# Patient Record
Sex: Male | Born: 1935 | Race: White | Hispanic: No | Smoking: Former smoker
Health system: Southern US, Community
[De-identification: ages and names within clinical notes are randomized; demographics above are authoritative.]

## PROBLEM LIST (undated history)

## (undated) DIAGNOSIS — R413 Other amnesia: Secondary | ICD-10-CM

## (undated) DIAGNOSIS — F988 Other specified behavioral and emotional disorders with onset usually occurring in childhood and adolescence: Secondary | ICD-10-CM

## (undated) DIAGNOSIS — E119 Type 2 diabetes mellitus without complications: Secondary | ICD-10-CM

## (undated) DIAGNOSIS — J449 Chronic obstructive pulmonary disease, unspecified: Secondary | ICD-10-CM

## (undated) DIAGNOSIS — R269 Unspecified abnormalities of gait and mobility: Secondary | ICD-10-CM

## (undated) HISTORY — DX: Chronic obstructive pulmonary disease, unspecified: J44.9

## (undated) HISTORY — DX: Type 2 diabetes mellitus without complications: E11.9

## (undated) HISTORY — DX: Other specified behavioral and emotional disorders with onset usually occurring in childhood and adolescence: F98.8

## (undated) HISTORY — DX: Unspecified abnormalities of gait and mobility: R26.9

## (undated) HISTORY — DX: Other amnesia: R41.3

---

## 2000-10-02 ENCOUNTER — Encounter: Payer: Self-pay | Admitting: Emergency Medicine

## 2000-10-02 ENCOUNTER — Inpatient Hospital Stay (HOSPITAL_COMMUNITY): Admission: AC | Admit: 2000-10-02 | Discharge: 2000-10-07 | Payer: Self-pay

## 2000-10-02 ENCOUNTER — Encounter: Payer: Self-pay | Admitting: Neurological Surgery

## 2000-11-14 ENCOUNTER — Encounter: Payer: Self-pay | Admitting: Neurological Surgery

## 2000-11-14 ENCOUNTER — Encounter: Admission: RE | Admit: 2000-11-14 | Discharge: 2000-11-14 | Payer: Self-pay | Admitting: Neurological Surgery

## 2001-01-06 ENCOUNTER — Emergency Department (HOSPITAL_COMMUNITY): Admission: EM | Admit: 2001-01-06 | Discharge: 2001-01-06 | Payer: Self-pay | Admitting: Emergency Medicine

## 2001-01-06 ENCOUNTER — Encounter: Payer: Self-pay | Admitting: Emergency Medicine

## 2006-07-07 ENCOUNTER — Emergency Department (HOSPITAL_COMMUNITY): Admission: EM | Admit: 2006-07-07 | Discharge: 2006-07-07 | Payer: Self-pay | Admitting: Emergency Medicine

## 2007-01-09 ENCOUNTER — Emergency Department (HOSPITAL_COMMUNITY): Admission: EM | Admit: 2007-01-09 | Discharge: 2007-01-09 | Payer: Self-pay | Admitting: Emergency Medicine

## 2007-01-12 ENCOUNTER — Emergency Department (HOSPITAL_COMMUNITY): Admission: EM | Admit: 2007-01-12 | Discharge: 2007-01-12 | Payer: Self-pay | Admitting: Emergency Medicine

## 2007-03-02 ENCOUNTER — Emergency Department (HOSPITAL_COMMUNITY): Admission: EM | Admit: 2007-03-02 | Discharge: 2007-03-03 | Payer: Self-pay | Admitting: Emergency Medicine

## 2007-03-23 ENCOUNTER — Emergency Department (HOSPITAL_COMMUNITY): Admission: EM | Admit: 2007-03-23 | Discharge: 2007-03-23 | Payer: Self-pay | Admitting: Emergency Medicine

## 2007-07-04 ENCOUNTER — Emergency Department (HOSPITAL_COMMUNITY): Admission: EM | Admit: 2007-07-04 | Discharge: 2007-07-05 | Payer: Self-pay | Admitting: Emergency Medicine

## 2008-04-25 ENCOUNTER — Emergency Department (HOSPITAL_COMMUNITY): Admission: EM | Admit: 2008-04-25 | Discharge: 2008-04-25 | Payer: Self-pay | Admitting: *Deleted

## 2010-04-18 LAB — GLUCOSE, CAPILLARY: Glucose-Capillary: 42 mg/dL — ABNORMAL LOW (ref 70–99)
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
## 2010-05-25 NOTE — Cardiovascular Report (Signed)
[place]. [facility]  Patient:    David Ayala, David Ayala Visit Number: [id number] MRN: [id number]          Service Type: MED Location: [id number] Attending Physician:  Trauma, Md Dictated by:   Darci Needle, M.D. Proc. Date: 10/06/00 Admit Date:  10/02/2000   CC:         Dr. Sandria Bales. [place]  Dr. Stefani Dama  Dr. Loetta Rough, [place], [place]   Cardiac Catheterization  INDICATION FOR PROCEDURE:  [name] is 26 and has a history of hypertrophic cardiomyopathy, mitral regurgitation and a suspicion of coronary artery disease.  A myocardial perfusion study in 1997 suggested inferior infarction and/or ischemia.  He was recently hospitalized at this time with a traumatic neck fracture and coronary angiography along with heart catheterization are felt to be required to stage the patient and determine risks for general anesthesia.  PROCEDURE PERFORMED: 1. Left heart catheterization. 2. Right heart catheterization. 3. Coronary angiography. 4. Left ventriculography.  DESCRIPTION:  After informed consent, an 8-French sheath was placed in the right femoral vein and a 6-French sheath in the right femoral artery.  Both were placed using the modified Seldinger technique following 2% local Xylocaine infiltration.  A 7-French Swan-Ganz balloon-tipped angled thermodilution/hemodynamic catheter was advanced through the right heart with measurement of hemodynamics.  In the main pulmonary artery, an O2 saturation sample was obtained.  Thermodilution cardiac outputs were also performed.  A 6-French A-2 multipurpose catheter was advanced via the 6-French right femoral artery sheath.  An aortic saturation was obtained.  Hemodynamic recordings were then made including a simultaneous wedge LV.  Pullback pressure using the Swan-Ganz catheter through the right heart was then performed.  Left ventriculography was then performed using the A2 multipurpose  catheter in the 35 degree RAO projection using 10 cc/sec for a total of 35 cc of contrast.  A pullback pressure was then made from left ventricular apex to aorta and no obvious gradient was identified.  Coronary angiography was then performed using the multipurpose catheter.  The patient tolerated the procedure without complications.  RESULTS:   I:  Hemodynamic data:       a. Mean wedge pressure 18 mmHg.       b. Pulmonary artery pressure 39/18 mmHg.       c. Right ventricular pressure 42/14 mmHg.       d. Right atrial mean pressure 13 mmHg.       e. Aortic pressure 113/80 mmHg.       f. LV pressure 114/19 mmHg.       g. Cardiac output 6.3 l/min (thermodilution); 5.1 l/min (Fick).  II:  Left ventriculography:  The left ventricle was mildly dilated.  LV       cavity size is upper normal.  Overall contractility is normal.  EF is       estimated to be 60-70%.  Two-plus mitral regurgitation is present.       There is calcification of the mitral annulus noted on cinefluoroscopy. III:  Coronary angiography:       a. Left main coronary:  Large and free of any significant obstruction.       b. Left anterior descending coronary:  The LAD gives origin to three          small diagonal branches.  The LAD wraps around the apex.  There is          20-30% narrowing in the proximal LAD.  c. Circumflex artery:  The circumflex artery gives origin to a diamond          at first obtuse marginal, followed by a continuation of the          circumflex that terminates on two small distal obtuse marginal          branches.  No significant obstruction is noted in the circumflex          system.       d. Right coronary:  The right coronary artery is large and dominant.  It          gives a PDA and a small left ventricular branch.  There is          20-30% narrowing in the mid RCA.  No high-grade obstruction is noted.  CONCLUSIONS: 1. Mild-to-moderate mitral regurgitation (1 to 2+). 2. Hypertrophic  cardiomyopathy with preserved systolic function.  There is no    measurable intracavitary gradient.  There is evidence, based upon the    patients left ventricular end-diastolic pressures, of diastolic    dysfunction. 3. Luminal irregularities noted in the right coronary and left anterior    descending but no significant obstructive disease is seen. 4. Normal-to-slightly elevated pulmonary artery pressures.  RECOMMENDATION:  The patient has no significant coronary artery disease. Mitral regurgitation is mild to moderate and certainly requires no specific attention at this time.  The patient does have known hypertrophic cardiomyopathy, although we were unable to measure an intracavitary gradient on this study.  It would appear that the patient would be a good and relatively safe candidate for general anesthesia as it relates to repair of his cervical fracture.  I would recommend volume loading and avoidance of vasodilators (due to the patients known inducible intracavitary LV gradient from his HOCM).  If he develops postoperative atrial fibrillation, would avoid digoxin (aggravates LV outflow obstruction in HOCM).  The patient is cleared for general anesthesia and should be relatively low risk for significant cardiovascular complications. Dictated by:   Darci Needle, M.D. Attending Physician:  Trauma, Md DD:  10/06/00 TD:  10/06/00 Job: [id number]
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
## 2010-09-27 LAB — URINALYSIS, ROUTINE W REFLEX MICROSCOPIC
Bilirubin Urine: NEGATIVE
Glucose, UA: >1000 — AB
Glucose, UA: >1000 — AB
Hgb urine dipstick: NEGATIVE
Hgb urine dipstick: NEGATIVE
Ketones, ur: NEGATIVE
Leukocytes, UA: NEGATIVE
Protein, ur: NEGATIVE
Specific Gravity, Urine: 1.03
Urobilinogen, UA: 1
pH: 6
pH: 6

## 2010-09-27 LAB — CBC
HCT: 35.3 — ABNORMAL LOW
HCT: 36.7 — ABNORMAL LOW
Hemoglobin: 12.5 — ABNORMAL LOW
MCV: 104.2 — ABNORMAL HIGH
Platelets: 233
RBC: 3.48 — ABNORMAL LOW
WBC: 5.2
WBC: 5.3

## 2010-09-27 LAB — BASIC METABOLIC PANEL
BUN: 14
BUN: 18
Chloride: 101
GFR calc Af Amer: >60
GFR calc non Af Amer: >60
GFR calc non Af Amer: >60
Glucose, Bld: 285 — ABNORMAL HIGH
Potassium: 4
Potassium: 4.1
Sodium: 135

## 2010-09-27 LAB — DIFFERENTIAL
Eosinophils Absolute: 0.1
Eosinophils Relative: 2
Eosinophils Relative: 2
Lymphocytes Relative: 37
Lymphocytes Relative: 37
Lymphs Abs: 2
Lymphs Abs: 2
Monocytes Absolute: 0.4
Neutrophils Relative %: 53

## 2010-09-27 LAB — URINE MICROSCOPIC-ADD ON

## 2010-09-28 LAB — URINALYSIS, ROUTINE W REFLEX MICROSCOPIC
Glucose, UA: >1000 — AB
Hgb urine dipstick: NEGATIVE
Specific Gravity, Urine: 1.031 — ABNORMAL HIGH
Urobilinogen, UA: 0.2

## 2010-09-28 LAB — DIFFERENTIAL
Basophils Relative: 1
Eosinophils Absolute: 0.1
Eosinophils Relative: 2
Monocytes Absolute: 0.5
Monocytes Relative: 7

## 2010-09-28 LAB — CBC
Hemoglobin: 13
MCHC: 34.4
MCV: 106.7 — ABNORMAL HIGH
RBC: 3.55 — ABNORMAL LOW

## 2010-09-28 LAB — BASIC METABOLIC PANEL
CO2: 29
Chloride: 95 — ABNORMAL LOW
GFR calc Af Amer: >60
Sodium: 130 — ABNORMAL LOW

## 2010-09-28 LAB — URINE MICROSCOPIC-ADD ON: Urine-Other: NONE SEEN

## 2010-10-01 LAB — URINALYSIS, ROUTINE W REFLEX MICROSCOPIC
Leukocytes, UA: NEGATIVE
Nitrite: NEGATIVE
pH: 6

## 2010-10-24 LAB — CBC
Hemoglobin: 14
Platelets: 220
RDW: 14.5 — ABNORMAL HIGH

## 2010-10-24 LAB — BASIC METABOLIC PANEL
BUN: 25 — ABNORMAL HIGH
Calcium: 9.3
Creatinine, Ser: 0.9
GFR calc non Af Amer: >60
Glucose, Bld: 609
Sodium: 133 — ABNORMAL LOW

## 2010-10-24 LAB — DIFFERENTIAL
Basophils Absolute: 0
Lymphocytes Relative: 25
Neutro Abs: 3.4
Neutrophils Relative %: 64

## 2011-03-20 ENCOUNTER — Other Ambulatory Visit (HOSPITAL_COMMUNITY): Payer: Self-pay | Admitting: *Deleted

## 2011-03-20 DIAGNOSIS — T17998A Other foreign object in respiratory tract, part unspecified causing other injury, initial encounter: Secondary | ICD-10-CM

## 2011-03-27 ENCOUNTER — Other Ambulatory Visit (HOSPITAL_COMMUNITY): Payer: Self-pay | Admitting: *Deleted

## 2011-03-27 DIAGNOSIS — T17998A Other foreign object in respiratory tract, part unspecified causing other injury, initial encounter: Secondary | ICD-10-CM

## 2011-03-28 ENCOUNTER — Other Ambulatory Visit (HOSPITAL_COMMUNITY): Payer: Self-pay

## 2011-03-28 ENCOUNTER — Ambulatory Visit (HOSPITAL_COMMUNITY): Payer: Self-pay

## 2011-04-02 ENCOUNTER — Ambulatory Visit (HOSPITAL_COMMUNITY)
Admission: RE | Admit: 2011-04-02 | Discharge: 2011-04-02 | Disposition: A | Discharge status: Home / Self Care | Payer: Medicare Other | Source: Ambulatory Visit | Attending: Internal Medicine | Admitting: Internal Medicine

## 2011-04-02 DIAGNOSIS — T17998A Other foreign object in respiratory tract, part unspecified causing other injury, initial encounter: Secondary | ICD-10-CM

## 2011-04-02 DIAGNOSIS — R1313 Dysphagia, pharyngeal phase: Secondary | ICD-10-CM | POA: Insufficient documentation

## 2011-04-02 LAB — GLUCOSE, CAPILLARY: Glucose-Capillary: 229 mg/dL — ABNORMAL HIGH (ref 70–99)

## 2011-04-02 NOTE — Procedures (Signed)
Objective Swallowing Evaluation: Modified Barium Swallowing Study  Patient Details  Name: David Ayala MRN: [id number] Date of Birth: 04-16-1931  Today's Date: 04/02/2011 Time:  -     Past Medical History: No past medical history on file. Past Surgical History: No past surgical history on file. HPI:  78 yr old from nursing facility seen at [facility] for outpatient MBS for possible upgrade from honey thick liquids.  PMH: pna, weakness, airway obstruction, dysphagia.       Recommendation/Prognosis  Clinical Impression Dysphagia Diagnosis: Mild oral phase dysphagia Clinical impression: Pt. exhibited a mild sensory-motor pharyngeal dysphagia characterized by delayed swallow initiation with thin liquid due to decreased sensation and minimal, intermittent vallecular and pyriform residue.  Majority of penetration episodes were flash in nature, therefore, spontaneously exited laryngeal vestibule during the swallow.  Trace amount of penetrates remaining on anterior wall of vestibule was cleared with volitional throat clears.  A regular diet texture with thin liquids is recommended with precautions (medium size sips of thin were more effecitve possibly due to increased volume and weight, single sips, intermittent throat clear).  Swallow Evaluation Recommendations Diet Recommendations: Regular;Thin liquid Liquid Administration via: Cup Medication Administration: Whole meds with puree Supervision: Intermittent supervision to cue for compensatory strategies Compensations: Slow rate;Clear throat intermittently (single sips) Postural Changes and/or Swallow Maneuvers: Seated upright 90 degrees Follow up Recommendations: Skilled Nursing facility    Individuals Consulted Consulted and Agree with Results and Recommendations: Patient Report Sent to : Referring physician  SLP Assessment/Plan  General:  HPI: 78 yr old from nursing facility seen at [facility] for outpatient MBS for  possible upgrade from honey thick liquids.  PMH: pna, weakness, airway obstruction, dysphagia.   Type of Study: Modified Barium Swallowing Study Diet Prior to this Study: Honey-thick liquids;Regular (SLP not certain of pt.'s diet texture) Behavior/Cognition: Alert;Cooperative;Pleasant mood Oral Cavity - Dentition: Adequate natural dentition Oral Motor / Sensory Function: Within functional limits Vision: Functional for self-feeding Patient Positioning: Upright in chair Baseline Vocal Quality: Clear Anatomy: Within functional limits Pharyngeal Secretions: Not observed secondary MBS  Oral Phase   Pharyngeal Phase  Pharyngeal Phase Pharyngeal Phase: Impaired Pharyngeal - Thin Pharyngeal - Thin Teaspoon: Delayed swallow initiation;Pharyngeal residue - valleculae;Pharyngeal residue - pyriform;Reduced laryngeal elevation;Reduced tongue base retraction Pharyngeal - Thin Cup: Delayed swallow initiation;Premature spillage to valleculae;Pharyngeal residue - valleculae;Pharyngeal residue - pyriform;Reduced tongue base retraction;Reduced laryngeal elevation;Penetration/Aspiration during swallow Penetration/Aspiration details (thin cup): Material enters airway, remains ABOVE vocal cords then ejected out;Material enters airway, remains ABOVE vocal cords and not ejected out (flash x 6, reamined in vestibule x 2, slight throat clear) Cervical Esophageal Phase  Cervical Esophageal Phase Cervical Esophageal Phase: [facility]    Darrow Bussing.Ed [employer] [phone]  04/02/2011

## 2012-04-09 DIAGNOSIS — N400 Enlarged prostate without lower urinary tract symptoms: Secondary | ICD-10-CM

## 2012-04-09 DIAGNOSIS — B0229 Other postherpetic nervous system involvement: Secondary | ICD-10-CM

## 2012-04-09 DIAGNOSIS — I509 Heart failure, unspecified: Secondary | ICD-10-CM

## 2012-04-09 DIAGNOSIS — I798 Other disorders of arteries, arterioles and capillaries in diseases classified elsewhere: Secondary | ICD-10-CM

## 2012-04-09 DIAGNOSIS — I2510 Atherosclerotic heart disease of native coronary artery without angina pectoris: Secondary | ICD-10-CM

## 2012-04-09 DIAGNOSIS — E1142 Type 2 diabetes mellitus with diabetic polyneuropathy: Secondary | ICD-10-CM

## 2012-04-09 DIAGNOSIS — E1159 Type 2 diabetes mellitus with other circulatory complications: Secondary | ICD-10-CM

## 2012-04-16 DIAGNOSIS — I509 Heart failure, unspecified: Secondary | ICD-10-CM

## 2012-04-16 DIAGNOSIS — E1142 Type 2 diabetes mellitus with diabetic polyneuropathy: Secondary | ICD-10-CM

## 2012-04-16 DIAGNOSIS — I798 Other disorders of arteries, arterioles and capillaries in diseases classified elsewhere: Secondary | ICD-10-CM

## 2012-04-16 DIAGNOSIS — B0229 Other postherpetic nervous system involvement: Secondary | ICD-10-CM

## 2012-04-16 DIAGNOSIS — E1159 Type 2 diabetes mellitus with other circulatory complications: Secondary | ICD-10-CM

## 2012-04-16 DIAGNOSIS — N400 Enlarged prostate without lower urinary tract symptoms: Secondary | ICD-10-CM

## 2012-04-16 DIAGNOSIS — I2510 Atherosclerotic heart disease of native coronary artery without angina pectoris: Secondary | ICD-10-CM

## 2012-05-28 DIAGNOSIS — E1142 Type 2 diabetes mellitus with diabetic polyneuropathy: Secondary | ICD-10-CM

## 2012-05-28 DIAGNOSIS — I2510 Atherosclerotic heart disease of native coronary artery without angina pectoris: Secondary | ICD-10-CM

## 2012-05-28 DIAGNOSIS — E1159 Type 2 diabetes mellitus with other circulatory complications: Secondary | ICD-10-CM

## 2012-05-28 DIAGNOSIS — B0229 Other postherpetic nervous system involvement: Secondary | ICD-10-CM

## 2012-05-28 DIAGNOSIS — I509 Heart failure, unspecified: Secondary | ICD-10-CM

## 2012-05-28 DIAGNOSIS — N400 Enlarged prostate without lower urinary tract symptoms: Secondary | ICD-10-CM

## 2012-05-28 DIAGNOSIS — I798 Other disorders of arteries, arterioles and capillaries in diseases classified elsewhere: Secondary | ICD-10-CM

## 2012-06-30 DIAGNOSIS — I509 Heart failure, unspecified: Secondary | ICD-10-CM

## 2012-06-30 DIAGNOSIS — N400 Enlarged prostate without lower urinary tract symptoms: Secondary | ICD-10-CM

## 2012-06-30 DIAGNOSIS — I798 Other disorders of arteries, arterioles and capillaries in diseases classified elsewhere: Secondary | ICD-10-CM

## 2012-06-30 DIAGNOSIS — B0229 Other postherpetic nervous system involvement: Secondary | ICD-10-CM

## 2012-06-30 DIAGNOSIS — E1142 Type 2 diabetes mellitus with diabetic polyneuropathy: Secondary | ICD-10-CM

## 2012-06-30 DIAGNOSIS — I2510 Atherosclerotic heart disease of native coronary artery without angina pectoris: Secondary | ICD-10-CM

## 2012-06-30 DIAGNOSIS — E1159 Type 2 diabetes mellitus with other circulatory complications: Secondary | ICD-10-CM

## 2012-07-01 ENCOUNTER — Other Ambulatory Visit: Payer: Self-pay | Admitting: *Deleted

## 2012-07-01 MED ORDER — ZOLPIDEM TARTRATE 5 MG PO TABS: ORAL_TABLET | ORAL | Status: DC

## 2012-08-25 ENCOUNTER — Non-Acute Institutional Stay (SKILLED_NURSING_FACILITY): Payer: PRIVATE HEALTH INSURANCE | Admitting: Internal Medicine

## 2012-08-25 DIAGNOSIS — E1149 Type 2 diabetes mellitus with other diabetic neurological complication: Secondary | ICD-10-CM

## 2012-08-25 DIAGNOSIS — I509 Heart failure, unspecified: Secondary | ICD-10-CM

## 2012-08-25 DIAGNOSIS — G609 Hereditary and idiopathic neuropathy, unspecified: Secondary | ICD-10-CM

## 2012-08-25 DIAGNOSIS — J4489 Other specified chronic obstructive pulmonary disease: Secondary | ICD-10-CM

## 2012-08-25 DIAGNOSIS — J449 Chronic obstructive pulmonary disease, unspecified: Secondary | ICD-10-CM

## 2012-08-29 DIAGNOSIS — J439 Emphysema, unspecified: Secondary | ICD-10-CM | POA: Insufficient documentation

## 2012-08-29 DIAGNOSIS — I5032 Chronic diastolic (congestive) heart failure: Secondary | ICD-10-CM | POA: Insufficient documentation

## 2012-08-29 DIAGNOSIS — E1149 Type 2 diabetes mellitus with other diabetic neurological complication: Secondary | ICD-10-CM | POA: Insufficient documentation

## 2012-08-29 DIAGNOSIS — E1142 Type 2 diabetes mellitus with diabetic polyneuropathy: Secondary | ICD-10-CM | POA: Insufficient documentation

## 2012-08-29 NOTE — Progress Notes (Signed)
PROGRESS NOTE  DATE: 08/25/2012  FACILITY: Nursing Home Location: [facility]  LEVEL OF CARE: SNF (31)  Routine Visit  CHIEF COMPLAINT:  Manage diabetes mellitus, COPD and Peripheral neuropathy  HISTORY OF PRESENT ILLNESS:  REASSESSMENT OF ONGOING PROBLEM(S):  DM:pt's DM remains stable.  Pt denies polyuria, polydipsia, polyphagia, changes in vision or hypoglycemic episodes.  No complications noted from the medication presently being used.  Last hemoglobin A1c is: 7.3 in 7/14  COPD: the COPD remains stable.  Pt denies sob, cough, wheezing or declining exercise tolerance.  No complications from the medications presently being used.  PERIPHERAL NEUROPATHY: The peripheral neuropathy is stable. The patient denies pain in the feet, tingling, and numbness. No complications noted from the medication presently being used.  PAST MEDICAL HISTORY : Reviewed.  No changes.  CURRENT MEDICATIONS: Reviewed per [facility]  REVIEW OF SYSTEMS:  GENERAL: no change in appetite, no fatigue, no weight changes, no fever, chills or weakness RESPIRATORY: no cough, SOB, DOE, wheezing, hemoptysis CARDIAC: no chest pain, edema or palpitations GI: no abdominal pain, diarrhea, constipation, heart burn, nausea or vomiting  PHYSICAL EXAMINATION  VS:  T 97.7       P 71     RR 20     BP 132/75     POX %     WT (Lb) 205.2  GENERAL: no acute distress, moderately obese body habitus EYES: conjunctivae normal, sclerae normal, normal eye lids NECK: supple, trachea midline, no neck masses, no thyroid tenderness, no thyromegaly LYMPHATICS: no LAN in the neck, no supraclavicular LAN RESPIRATORY: breathing is even & unlabored, BS CTAB CARDIAC: RRR, no murmur,no extra heart sounds, no edema GI: abdomen soft, normal BS, no masses, no tenderness, no hepatomegaly, no splenomegaly PSYCHIATRIC: the patient is alert & oriented to person, affect & behavior appropriate  LABS/RADIOLOGY:  7/14  hemoglobin 12.2, MCV 81 otherwise CBC normal, glucose 171, BUN 25 otherwise BMP normal, HDL 27 otherwise fasting lipid panel normal 4-14 serum iron 38, TIBC 286, percent saturation 13, albumin 3.3 otherwise liver profile normal, TSH 3.361, ferritin 22, vitamin B12 256  ASSESSMENT/PLAN:  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 COPD-well compensated. Peripheral neuropathy-continue Neurontin CHF-well compensated. Insomnia-continue Ambien. GERD-stable Constipation-well-controlled. Vitamin B12 deficiency-continue supplementation  CPT CODE: 40981

## 2012-09-22 ENCOUNTER — Non-Acute Institutional Stay (SKILLED_NURSING_FACILITY): Payer: PRIVATE HEALTH INSURANCE | Admitting: Internal Medicine

## 2012-09-22 DIAGNOSIS — J449 Chronic obstructive pulmonary disease, unspecified: Secondary | ICD-10-CM

## 2012-09-22 DIAGNOSIS — G609 Hereditary and idiopathic neuropathy, unspecified: Secondary | ICD-10-CM

## 2012-09-22 DIAGNOSIS — I509 Heart failure, unspecified: Secondary | ICD-10-CM

## 2012-09-22 DIAGNOSIS — E1149 Type 2 diabetes mellitus with other diabetic neurological complication: Secondary | ICD-10-CM

## 2012-09-23 NOTE — Progress Notes (Signed)
PROGRESS NOTE  DATE: 09-22-12  FACILITY: Nursing Home Location: [facility]  LEVEL OF CARE: SNF (31)  Routine Visit  CHIEF COMPLAINT:  Manage diabetes mellitus, COPD and Peripheral neuropathy  HISTORY OF PRESENT ILLNESS:  REASSESSMENT OF ONGOING PROBLEM(S):  DM:pt's DM remains stable.  Pt denies polyuria, polydipsia, polyphagia, changes in vision or hypoglycemic episodes.  No complications noted from the medication presently being used.  Last hemoglobin A1c is: 7.3 in 7/14, in 8-14 hemoglobin A1c 7.1.  COPD: the COPD remains stable.  Pt denies sob, cough, wheezing or declining exercise tolerance.  No complications from the medications presently being used.  PERIPHERAL NEUROPATHY: The peripheral neuropathy is stable. The patient denies pain in the feet, tingling, and numbness. No complications noted from the medication presently being used.  PAST MEDICAL HISTORY : Reviewed.  No changes.  CURRENT MEDICATIONS: Reviewed per [facility]  REVIEW OF SYSTEMS:  GENERAL: no change in appetite, no fatigue, no weight changes, no fever, chills or weakness RESPIRATORY: no cough, SOB, DOE, wheezing, hemoptysis CARDIAC: no chest pain, edema or palpitations GI: no abdominal pain, diarrhea, constipation, heart burn, nausea or vomiting  PHYSICAL EXAMINATION  VS:  T 98       P 72     RR 18    BP 110/64    POX %     WT (Lb) 202.2  GENERAL: no acute distress, moderately obese body habitus EYES: conjunctivae normal, sclerae normal, normal eye lids NECK: supple, trachea midline, no neck masses, no thyroid tenderness, no thyromegaly LYMPHATICS: no LAN in the neck, no supraclavicular LAN RESPIRATORY: breathing is even & unlabored, BS CTAB CARDIAC: RRR, no murmur,no extra heart sounds, no edema GI: abdomen soft, normal BS, no masses, no tenderness, no hepatomegaly, no splenomegaly PSYCHIATRIC: the patient is alert & oriented to person, affect & behavior  appropriate  LABS/RADIOLOGY:  8-14 hemoglobin 11 MCV 83.9 otherwise CBC normal, glucose 138 otherwise BMP normal  7/14 hemoglobin 12.2, MCV 81 otherwise CBC normal, glucose 171, BUN 25 otherwise BMP normal, HDL 27 otherwise fasting lipid panel normal 4-14 serum iron 38, TIBC 286, percent saturation 13, albumin 3.3 otherwise liver profile normal, TSH 3.361, ferritin 22, vitamin B12 256  ASSESSMENT/PLAN:  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 COPD-well compensated. Peripheral neuropathy-continue Neurontin CHF-well compensated. Insomnia-continue Ambien. GERD-stable Constipation-well-controlled. Vitamin B12 deficiency-continue supplementation  CPT CODE: 16109

## 2012-10-26 ENCOUNTER — Non-Acute Institutional Stay (SKILLED_NURSING_FACILITY): Payer: PRIVATE HEALTH INSURANCE | Admitting: Internal Medicine

## 2012-10-26 DIAGNOSIS — E1149 Type 2 diabetes mellitus with other diabetic neurological complication: Secondary | ICD-10-CM

## 2012-10-26 DIAGNOSIS — G609 Hereditary and idiopathic neuropathy, unspecified: Secondary | ICD-10-CM

## 2012-10-26 DIAGNOSIS — J449 Chronic obstructive pulmonary disease, unspecified: Secondary | ICD-10-CM

## 2012-10-26 DIAGNOSIS — I509 Heart failure, unspecified: Secondary | ICD-10-CM

## 2012-10-26 NOTE — Progress Notes (Signed)
PROGRESS NOTE  DATE: 10-26-12  FACILITY: Nursing Home Location: [facility]  LEVEL OF CARE: SNF (31)  Routine Visit  CHIEF COMPLAINT:  Manage diabetes mellitus, COPD and Peripheral neuropathy  HISTORY OF PRESENT ILLNESS:  REASSESSMENT OF ONGOING PROBLEM(S):  DM:pt's DM remains stable.  Pt denies polyuria, polydipsia, polyphagia, changes in vision or hypoglycemic episodes.  No complications noted from the medication presently being used.  Last hemoglobin A1c is: 7.3 in 7/14, in 8-14 hemoglobin A1c 7.1.  COPD: the COPD remains stable.  Pt denies sob, cough, wheezing or declining exercise tolerance.  No complications from the medications presently being used.  PERIPHERAL NEUROPATHY: The peripheral neuropathy is stable. The patient denies pain in the feet, tingling, and numbness. No complications noted from the medication presently being used.  PAST MEDICAL HISTORY : Reviewed.  No changes.  CURRENT MEDICATIONS: Reviewed per [facility]  REVIEW OF SYSTEMS:  GENERAL: no change in appetite, no fatigue, no weight changes, no fever, chills or weakness RESPIRATORY: no cough, SOB, DOE, wheezing, hemoptysis CARDIAC: no chest pain, edema or palpitations GI: no abdominal pain, diarrhea, constipation, heart burn, nausea or vomiting  PHYSICAL EXAMINATION  VS:  T 97.9       P 79     RR 18    BP 128/82   POX %     WT (Lb) 195.2  GENERAL: no acute distress, moderately obese body habitus EYES: conjunctivae normal, sclerae normal, normal eye lids NECK: supple, trachea midline, no neck masses, no thyroid tenderness, no thyromegaly LYMPHATICS: no LAN in the neck, no supraclavicular LAN RESPIRATORY: breathing is even & unlabored, BS CTAB CARDIAC: RRR, no murmur,no extra heart sounds, no edema GI: abdomen soft, normal BS, no masses, no tenderness, no hepatomegaly, no splenomegaly PSYCHIATRIC: the patient is alert & oriented to person, affect & behavior  appropriate  LABS/RADIOLOGY:  9-14 Hb 11.5, mcv 81.8 ow cbc nl, glc 301, bun 26 ow bmp nl  8-14 hemoglobin 11 MCV 83.9 otherwise CBC normal, glucose 138 otherwise BMP normal  7/14 hemoglobin 12.2, MCV 81 otherwise CBC normal, glucose 171, BUN 25 otherwise BMP normal, HDL 27 otherwise fasting lipid panel normal 4-14 serum iron 38, TIBC 286, percent saturation 13, albumin 3.3 otherwise liver profile normal, TSH 3.361, ferritin 22, vitamin B12 256  ASSESSMENT/PLAN:  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 COPD-well compensated. Peripheral neuropathy-continue Neurontin CHF-well compensated. Insomnia-continue Ambien. GERD-stable Constipation-well-controlled. Vitamin B12 deficiency-continue supplementation Check liver profile.  CPT CODE: 16109

## 2012-11-23 ENCOUNTER — Non-Acute Institutional Stay (SKILLED_NURSING_FACILITY): Payer: PRIVATE HEALTH INSURANCE | Admitting: Internal Medicine

## 2012-11-23 DIAGNOSIS — E1149 Type 2 diabetes mellitus with other diabetic neurological complication: Secondary | ICD-10-CM

## 2012-11-23 DIAGNOSIS — J449 Chronic obstructive pulmonary disease, unspecified: Secondary | ICD-10-CM

## 2012-11-23 DIAGNOSIS — G609 Hereditary and idiopathic neuropathy, unspecified: Secondary | ICD-10-CM

## 2012-11-23 DIAGNOSIS — L0291 Cutaneous abscess, unspecified: Secondary | ICD-10-CM

## 2012-11-25 ENCOUNTER — Encounter: Payer: Self-pay | Admitting: Internal Medicine

## 2012-11-25 DIAGNOSIS — L03116 Cellulitis of left lower limb: Secondary | ICD-10-CM | POA: Insufficient documentation

## 2012-11-25 DIAGNOSIS — E1149 Type 2 diabetes mellitus with other diabetic neurological complication: Secondary | ICD-10-CM | POA: Insufficient documentation

## 2012-11-25 NOTE — Progress Notes (Signed)
PROGRESS NOTE  DATE: 10-26-12  FACILITY: Nursing Home Location: [facility]  LEVEL OF CARE: SNF (31)  Routine Visit  CHIEF COMPLAINT:  Manage suprapubic abscess, diabetes mellitus, COPD and Peripheral neuropathy  HISTORY OF PRESENT ILLNESS:  REASSESSMENT OF ONGOING PROBLEM(S):  SUPRAPUBIC ABCESS: New problem. Patient is complaining of a small people in the mid suprapubic area. It is very painful and draining pus. He cannot identify precipitating or alleviating factors.  DM:pt's DM remains stable.  Pt denies polyuria, polydipsia, polyphagia, changes in vision or hypoglycemic episodes.  No complications noted from the medication presently being used.  Last hemoglobin A1c is: 7.3 in 7/14, in 8-14 hemoglobin A1c 7.1.  COPD: the COPD remains stable.  Pt denies sob, cough, wheezing or declining exercise tolerance.  No complications from the medications presently being used.  PERIPHERAL NEUROPATHY: The peripheral neuropathy is stable. The patient denies pain in the feet, tingling, and numbness. No complications noted from the medication presently being used.  PAST MEDICAL HISTORY : Reviewed.  No changes.  CURRENT MEDICATIONS: Reviewed per [facility]  REVIEW OF SYSTEMS:  GENERAL: no change in appetite, no fatigue, no weight changes, no fever, chills or weakness RESPIRATORY: no cough, SOB, DOE, wheezing, hemoptysis CARDIAC: no chest pain, edema or palpitations GI: no abdominal pain, diarrhea, constipation, heart burn, nausea or vomiting  PHYSICAL EXAMINATION  VS:  T 97.6      P 79     RR 20    BP 118/70   POX %     WT (Lb) 195  GENERAL: no acute distress, moderately obese body habitus SKIN: 1 mm in size abscess  with thick white discharge, area is exquisitely tender to palpation, there is erythema EYES: conjunctivae normal, sclerae normal, normal eye lids NECK: supple, trachea midline, no neck masses, no thyroid tenderness, no thyromegaly LYMPHATICS: no LAN  in the neck, no supraclavicular LAN RESPIRATORY: breathing is even & unlabored, BS CTAB CARDIAC: RRR, no murmur,no extra heart sounds, no edema GI: abdomen soft, normal BS, no masses, no tenderness, no hepatomegaly, no splenomegaly PSYCHIATRIC: the patient is alert & oriented to person, affect & behavior appropriate  LABS/RADIOLOGY:  10-14 albumin 3.4 otherwise liver profile normal  9-14 Hb 11.5, mcv 81.8 ow cbc nl, glc 301, bun 26 ow bmp nl  8-14 hemoglobin 11 MCV 83.9 otherwise CBC normal, glucose 138 otherwise BMP normal  7/14 hemoglobin 12.2, MCV 81 otherwise CBC normal, glucose 171, BUN 25 otherwise BMP normal, HDL 27 otherwise fasting lipid panel normal 4-14 serum iron 38, TIBC 286, percent saturation 13, albumin 3.3 otherwise liver profile normal, TSH 3.361, ferritin 22, vitamin B12 256  ASSESSMENT/PLAN:  Suprapubic abscess-new problem. Start bacitracin ointment daily for one week. 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 COPD-well compensated. Peripheral neuropathy-continue Neurontin CHF-well compensated. Insomnia-continue Ambien. GERD-stable Constipation-well-controlled. Vitamin B12 deficiency-continue supplementation  CPT CODE: 78295

## 2012-12-14 ENCOUNTER — Non-Acute Institutional Stay (SKILLED_NURSING_FACILITY): Payer: PRIVATE HEALTH INSURANCE | Admitting: Internal Medicine

## 2012-12-14 ENCOUNTER — Encounter: Payer: Self-pay | Admitting: Internal Medicine

## 2012-12-14 DIAGNOSIS — I509 Heart failure, unspecified: Secondary | ICD-10-CM

## 2012-12-14 DIAGNOSIS — E1149 Type 2 diabetes mellitus with other diabetic neurological complication: Secondary | ICD-10-CM

## 2012-12-14 DIAGNOSIS — G609 Hereditary and idiopathic neuropathy, unspecified: Secondary | ICD-10-CM

## 2012-12-14 DIAGNOSIS — J449 Chronic obstructive pulmonary disease, unspecified: Secondary | ICD-10-CM

## 2012-12-14 NOTE — Progress Notes (Signed)
PROGRESS NOTE  DATE: 12-14-12  FACILITY: Nursing Home Location: [facility]  LEVEL OF CARE: SNF (31)  Routine Visit  CHIEF COMPLAINT:  Manage diabetes mellitus, COPD and Peripheral neuropathy  HISTORY OF PRESENT ILLNESS:  REASSESSMENT OF ONGOING PROBLEM(S):  DM:pt's DM remains stable.  Pt denies polyuria, polydipsia, polyphagia, changes in vision or hypoglycemic episodes.  No complications noted from the medication presently being used.  Last hemoglobin A1c is: 7.3 in 7/14, in 8-14 hemoglobin A1c 7.1, in 11- 14 hemoglobin A1c 8.3.  COPD: the COPD remains stable.  Pt denies sob, cough, wheezing or declining exercise tolerance.  No complications from the medications presently being used.  PERIPHERAL NEUROPATHY: The peripheral neuropathy is stable. The patient denies pain in the feet, tingling, and numbness. No complications noted from the medication presently being used.  PAST MEDICAL HISTORY : Reviewed.  No changes.  CURRENT MEDICATIONS: Reviewed per [facility]  REVIEW OF SYSTEMS:  GENERAL: no change in appetite, no fatigue, no weight changes, no fever, chills or weakness RESPIRATORY: no cough, SOB, DOE, wheezing, hemoptysis CARDIAC: no chest pain, edema or palpitations GI: no abdominal pain, diarrhea, constipation, heart burn, nausea or vomiting  PHYSICAL EXAMINATION  VS:  T 97.9      P 74     RR 22    BP 132/68  POX %     WT (Lb) 197.6  GENERAL: no acute distress, moderately obese body habitus SKIN: 1 mm in size abscess  with thick white discharge, area is exquisitely tender to palpation, there is erythema EYES: conjunctivae normal, sclerae normal, normal eye lids NECK: supple, trachea midline, no neck masses, no thyroid tenderness, no thyromegaly LYMPHATICS: no LAN in the neck, no supraclavicular LAN RESPIRATORY: breathing is even & unlabored, BS CTAB CARDIAC: RRR, no murmur,no extra heart sounds, no edema GI: abdomen soft, normal BS, no masses,  no tenderness, no hepatomegaly, no splenomegaly PSYCHIATRIC: the patient is alert & oriented to person, affect & behavior appropriate  LABS/RADIOLOGY:  11-14 CBC normal, glucose 346, BUN 29 otherwise BMP normal, LDL 103, HDL 37 otherwise fasting lipid panel normal  10-14 albumin 3.4 otherwise liver profile normal  9-14 Hb 11.5, mcv 81.8 ow cbc nl, glc 301, bun 26 ow bmp nl  8-14 hemoglobin 11 MCV 83.9 otherwise CBC normal, glucose 138 otherwise BMP normal  7/14 hemoglobin 12.2, MCV 81 otherwise CBC normal, glucose 171, BUN 25 otherwise BMP normal, HDL 27 otherwise fasting lipid panel normal 4-14 serum iron 38, TIBC 286, percent saturation 13, albumin 3.3 otherwise liver profile normal, TSH 3.361, ferritin 22, vitamin B12 256  ASSESSMENT/PLAN:  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 COPD-well compensated. Peripheral neuropathy-continue Neurontin CHF-well compensated. Insomnia-continue Ambien. GERD-stable Constipation-well-controlled. Vitamin B12 deficiency-continue supplementation  CPT CODE: 56213

## 2013-01-18 ENCOUNTER — Non-Acute Institutional Stay (SKILLED_NURSING_FACILITY): Payer: PRIVATE HEALTH INSURANCE | Admitting: Internal Medicine

## 2013-01-18 DIAGNOSIS — J09X2 Influenza due to identified novel influenza A virus with other respiratory manifestations: Secondary | ICD-10-CM

## 2013-01-18 DIAGNOSIS — J449 Chronic obstructive pulmonary disease, unspecified: Secondary | ICD-10-CM

## 2013-01-18 DIAGNOSIS — G609 Hereditary and idiopathic neuropathy, unspecified: Secondary | ICD-10-CM

## 2013-01-18 DIAGNOSIS — E1149 Type 2 diabetes mellitus with other diabetic neurological complication: Secondary | ICD-10-CM

## 2013-01-19 DIAGNOSIS — J09X2 Influenza due to identified novel influenza A virus with other respiratory manifestations: Secondary | ICD-10-CM | POA: Insufficient documentation

## 2013-01-19 NOTE — Progress Notes (Signed)
        PROGRESS NOTE  DATE: 01-18-13  FACILITY: Nursing Home Location: [facility]  LEVEL OF CARE: SNF (31)  Routine Visit  CHIEF COMPLAINT:  Manage diabetes mellitus, COPD and Peripheral neuropathy  HISTORY OF PRESENT ILLNESS:  REASSESSMENT OF ONGOING PROBLEM(S):  DM:pt's DM remains stable.  Pt denies polyuria, polydipsia, polyphagia, changes in vision or hypoglycemic episodes.  No complications noted from the medication presently being used.  Last hemoglobin A1c is: 7.3 in 7/14, in 8-14 hemoglobin A1c 7.1, in 11- 14 hemoglobin A1c 8.3.  COPD: the COPD remains stable.  Pt denies sob, cough, wheezing or declining exercise tolerance.  No complications from the medications presently being used.  PERIPHERAL NEUROPATHY: The peripheral neuropathy is stable. The patient denies pain in the feet, tingling, and numbness. No complications noted from the medication presently being used.  PAST MEDICAL HISTORY : Reviewed.  No changes.  [facility]  REVIEW OF SYSTEMS:  GENERAL: no change in appetite, no fatigue, no weight changes, no fever, chills or weakness RESPIRATORY: no cough, SOB, DOE, wheezing, hemoptysis CARDIAC: no chest pain, edema or palpitations GI: no abdominal pain, diarrhea, constipation, heart burn, nausea or vomiting  PHYSICAL EXAMINATION  VS:  T 97.9      P 75     RR 20    BP 110/60  POX %     WT (Lb) 198.2  GENERAL: no acute distress, moderately obese body habitus EYES: conjunctivae normal, sclerae normal, normal eye lids NECK: supple, trachea midline, no neck masses, no thyroid tenderness, no thyromegaly LYMPHATICS: no LAN in the neck, no supraclavicular LAN RESPIRATORY: breathing is even & unlabored, BS CTAB CARDIAC: RRR, no murmur,no extra heart sounds, no edema GI: abdomen soft, normal BS, no masses, no tenderness, no hepatomegaly, no splenomegaly PSYCHIATRIC: the patient is alert & oriented to person, affect &  behavior appropriate  LABS/RADIOLOGY:  12-14 vitamin B12 level 125  11-14 CBC normal, glucose 346, BUN 29 otherwise BMP normal, LDL 103, HDL 37 otherwise fasting lipid panel normal  10-14 albumin 3.4 otherwise liver profile normal  9-14 Hb 11.5, mcv 81.8 ow cbc nl, glc 301, bun 26 ow bmp nl  8-14 hemoglobin 11 MCV 83.9 otherwise CBC normal, glucose 138 otherwise BMP normal  7/14 hemoglobin 12.2, MCV 81 otherwise CBC normal, glucose 171, BUN 25 otherwise BMP normal, HDL 27 otherwise fasting lipid panel normal 4-14 serum iron 38, TIBC 286, percent saturation 13, albumin 3.3 otherwise liver profile normal, TSH 3.361, ferritin 22, vitamin B12 256  ASSESSMENT/PLAN:  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 COPD-well compensated. Peripheral neuropathy-continue Neurontin Influenza A-new problem. Tamiflu was started. CHF-well compensated. Insomnia-continue Ambien. GERD-stable Constipation-well-controlled. Vitamin B12 deficiency-B12 level is low.  DC IgM injections. Start vitamin B12 thousand micrograms daily. Check vitamin B12 and in one month.  CPT CODE: [id number]

## 2013-04-29 ENCOUNTER — Non-Acute Institutional Stay (SKILLED_NURSING_FACILITY): Payer: PRIVATE HEALTH INSURANCE | Admitting: Internal Medicine

## 2013-04-29 ENCOUNTER — Other Ambulatory Visit: Payer: Self-pay | Admitting: Internal Medicine

## 2013-04-29 DIAGNOSIS — J449 Chronic obstructive pulmonary disease, unspecified: Secondary | ICD-10-CM

## 2013-04-29 DIAGNOSIS — R4182 Altered mental status, unspecified: Secondary | ICD-10-CM

## 2013-04-29 DIAGNOSIS — G609 Hereditary and idiopathic neuropathy, unspecified: Secondary | ICD-10-CM

## 2013-04-29 DIAGNOSIS — E1149 Type 2 diabetes mellitus with other diabetic neurological complication: Secondary | ICD-10-CM

## 2013-04-29 DIAGNOSIS — I509 Heart failure, unspecified: Secondary | ICD-10-CM

## 2013-04-30 ENCOUNTER — Ambulatory Visit (HOSPITAL_COMMUNITY)
Admission: RE | Admit: 2013-04-30 | Discharge: 2013-04-30 | Disposition: A | Discharge status: Home / Self Care | Payer: PRIVATE HEALTH INSURANCE | Source: Ambulatory Visit | Attending: Internal Medicine | Admitting: Internal Medicine

## 2013-04-30 ENCOUNTER — Other Ambulatory Visit: Payer: Self-pay | Admitting: Internal Medicine

## 2013-04-30 DIAGNOSIS — R1310 Dysphagia, unspecified: Secondary | ICD-10-CM

## 2013-04-30 DIAGNOSIS — R0602 Shortness of breath: Secondary | ICD-10-CM | POA: Insufficient documentation

## 2013-04-30 DIAGNOSIS — R4182 Altered mental status, unspecified: Secondary | ICD-10-CM | POA: Insufficient documentation

## 2013-04-30 NOTE — Progress Notes (Signed)
        PROGRESS NOTE  DATE: 04-29-13  FACILITY: Nursing Home Location: [facility]  LEVEL OF CARE: SNF (31)  Routine Visit  CHIEF COMPLAINT:  Manage diabetes mellitus, COPD and Peripheral neuropathy  HISTORY OF PRESENT ILLNESS:  REASSESSMENT OF ONGOING PROBLEM(S):  DM:pt's DM remains stable.  Pt denies polyuria, polydipsia, polyphagia, changes in vision or hypoglycemic episodes.  No complications noted from the medication presently being used.  Last hemoglobin A1c is: 7.3 in 7/14, in 8-14 hemoglobin A1c 7.1, in 11- 14 hemoglobin A1c 8.3, in 4-15 hemoglobin A1c 7.9  COPD: the COPD remains stable.  Pt denies sob, cough, wheezing or declining exercise tolerance.  No complications from the medications presently being used.  PERIPHERAL NEUROPATHY: The peripheral neuropathy is stable. The patient denies pain in the feet, tingling, and numbness. No complications noted from the medication presently being used.  PAST MEDICAL HISTORY : Reviewed.  No changes.  [facility]: [facility]  REVIEW OF SYSTEMS:  GENERAL: no change in appetite, no fatigue, no weight changes, no fever, chills or weakness RESPIRATORY: no cough, SOB, DOE, wheezing, hemoptysis CARDIAC: no chest pain, edema or palpitations GI: no abdominal pain, diarrhea, constipation, heart burn, nausea or vomiting  PHYSICAL EXAMINATION  VS:  See vital signs section  GENERAL: no acute distress, moderately obese body habitus EYES: conjunctivae normal, sclerae normal, normal eye lids NECK: supple, trachea midline, no neck masses, no thyroid tenderness, no thyromegaly LYMPHATICS: no LAN in the neck, no supraclavicular LAN RESPIRATORY: breathing is even & unlabored, BS CTAB CARDIAC: RRR, no murmur,no extra heart sounds, no edema GI: abdomen soft, normal BS, no masses, no tenderness, no hepatomegaly, no splenomegaly PSYCHIATRIC: the patient is alert & oriented to person, affect & behavior  appropriate  LABS/RADIOLOGY: 4-15 glucose 115, BUN 26 otherwise BMP normal, hemoglobin 11.1, MCV 85.1 otherwise CBC normal 3-15 TSH 3.956 2-15 vitamin B12 level 252 12-14 vitamin B12 level 125  11-14 CBC normal, glucose 346, BUN 29 otherwise BMP normal, LDL 103, HDL 37 otherwise fasting lipid panel normal  10-14 albumin 3.4 otherwise liver profile normal  9-14 Hb 11.5, mcv 81.8 ow cbc nl, glc 301, bun 26 ow bmp nl  8-14 hemoglobin 11 MCV 83.9 otherwise CBC normal, glucose 138 otherwise BMP normal  7/14 hemoglobin 12.2, MCV 81 otherwise CBC normal, glucose 171, BUN 25 otherwise BMP normal, HDL 27 otherwise fasting lipid panel normal 4-14 serum iron 38, TIBC 286, percent saturation 13, albumin 3.3 otherwise liver profile normal, TSH 3.361, ferritin 22, vitamin B12 256  ASSESSMENT/PLAN:  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 COPD-well compensated. Peripheral neuropathy-continue Neurontin CHF-well compensated. Insomnia-continue Ambien. GERD-stable Constipation-well-controlled. Vitamin B12 deficiency-continue supplementation Depression-Cymbalta was increased  CPT CODE: [id number]  Newton PiggGayani Y. Kerry Doryasanayaka, MD [facility] [phone]

## 2013-05-17 ENCOUNTER — Encounter: Payer: Self-pay | Admitting: Neurology

## 2013-05-17 ENCOUNTER — Ambulatory Visit (INDEPENDENT_AMBULATORY_CARE_PROVIDER_SITE_OTHER): Payer: PRIVATE HEALTH INSURANCE | Admitting: Neurology

## 2013-05-17 VITALS — BP 107/60 | HR 59 | Ht 71.0 in | Wt 197.0 lb

## 2013-05-17 DIAGNOSIS — R269 Unspecified abnormalities of gait and mobility: Secondary | ICD-10-CM

## 2013-05-17 DIAGNOSIS — R413 Other amnesia: Secondary | ICD-10-CM | POA: Insufficient documentation

## 2013-05-17 DIAGNOSIS — F988 Other specified behavioral and emotional disorders with onset usually occurring in childhood and adolescence: Secondary | ICD-10-CM

## 2013-05-17 NOTE — Progress Notes (Signed)
PATIENT: David Ayala DOB: [birth year]  HISTORICAL  David Ayala is referred by his primary care Dr. Lequita Asal with [place] friend of more than 40 years, Richard Biddy.at today's visit for  memory trouble, confusion, decreased mobility. He currently lives at [facility] assistance bathing, has been there for one year, as it in the system living for 5 years.  Per referring material, he had past medical history of hypertension, coronary artery disease, congestive heart failure, peripheral vascular disease, diabetes since 2007, prostate hypertrophy, depression, shingles,. 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 Penicillins     HOME MEDICATIONS: Current Outpatient Prescriptions on File Prior to Visit  Medication Sig Dispense Refill  . aspirin 81 MG tablet Take 81 mg by mouth daily.      Marland Kitchen zolpidem (AMBIEN) 5 MG tablet Take one tablet by mouth at bedtime for rest  30 tablet  5   No current facility-administered medications on file prior to visit.    PAST MEDICAL  HISTORY: Past Medical History  Diagnosis Date  . Gait abnormality   . Memory change   . COPD (chronic obstructive pulmonary disease)   . Diabetes   . ADD (attention deficit disorder)     PAST SURGICAL HISTORY:  Diverticulitis, hemicolectomy, 1976   FAMILY HISTORY: Family History  Problem Relation Age of Onset  . Heart Problems Mother     SOCIAL HISTORY:  History   Social History  . Marital Status: Divorced    Spouse Name: N/A    Number of Children: 34  . Years of Education: college   Occupational History    Retired for Paramedic, he retried at age 16.   Social History Main Topics  . Smoking status: Former Research scientist (life sciences)  . Smokeless tobacco: Never Used     Comment: Quit 25 years ago.  . Alcohol Use: 0.6 oz/week    1 Shots of liquor per week     Comment: OCC.  . Drug Use: No  . Sexual Activity: Not on file   Other Topics Concern  . Not on file   Social History Narrative   Patient lives in [facility] assisted Living.   Retired.   Education college   Right handed.   Caffeine None       Patient was with his POA Richard Biddy [phone].     PHYSICAL EXAM   Filed Vitals:   05/17/13 0928  BP: 107/60  Pulse: 59  Height: 5' 11"  (1.803 m)  Weight: 197 lb (89.359 kg)    Not recorded    Body mass index is  27.49 kg/(m^2).   Generalized: In no acute distress  Neck: Supple, no carotid bruits   Cardiac: Regular rate rhythm  Pulmonary: Clear to auscultation bilaterally  Musculoskeletal: No deformity  Neurological examination  Mentation: Alert oriented to time, place, history taking, and causual conversation, Mini-Mental Status Examination is 19 out of 30, he has missed 1 out of 3 recalls, he is not oriented to the date, month, place, was not able to spell WORLD backwards.  Cranial nerve II-XII: Pupils were equal round reactive to light. Extraocular movements were full.  Visual field were full on confrontational test. Bilateral fundi were sharp.   Facial sensation and strength were normal. Hearing was intact to finger rubbing bilaterally. Uvula tongue midline.  Head turning and shoulder shrug and were normal and symmetric.Tongue protrusion into cheek strength was normal.  Motor: Normal tone, bulk and strength.  Sensory: Skin discoloration at the distal leg, length dependent decreased  fine touch, pinprick to mid shin level   Coordination: Normal finger to nose, heel-to-shin bilaterally there was no truncal ataxia  Gait: Rising up from seated position by pushing on chair arm, cautious wide-based unsteady gait   Romberg signs: Negative  Deep tendon reflexes: Brachioradialis 2/2, biceps 2/2, triceps 2/2, patellar 2/2, Achilles  trace, plantar responses were flexor bilaterally.   DIAGNOSTIC DATA (LABS, IMAGING, TESTING) - I reviewed patient records, labs, notes, testing and imaging myself where available.  Lab Results  Component Value Date   WBC 6.4 03/02/2007   HGB 13.0 03/02/2007   HCT 37.9* 03/02/2007   MCV 106.7* 03/02/2007   PLT 236 03/02/2007      Component Value Date/Time   NA 130* 03/02/2007 2118   K 4.8 03/02/2007 2118   CL 95* 03/02/2007 2118   CO2 29 03/02/2007 2118   GLUCOSE 405* 03/02/2007 2118   BUN 20 03/02/2007 2118   CREATININE 1.06 03/02/2007 2118   CALCIUM 9.0 03/02/2007 2118   GFRNONAA >60 03/02/2007 2118   GFRAA  Value: >60        The eGFR has been calculated using the MDRD equation. This calculation has not been validated in all clinical 03/02/2007 2118    ASSESSMENT AND PLAN  David Ayala is a 80 y.o. male complains of  few years history of rapid onset memory trouble, he has vascular risk factor of hypertension, previous heavy smoker, previous poorly controlled diabetes, coronary artery disease, peripheral vascular disease, questionable history of stroke, there was no family history of dementia,  1. his complains of memory trouble most likely due to combination of aging, multiple small vessel  disease, 2 MRI of brain 3 laboratory evaluations 4 physical therapy 5 return to clinic in 3 months.   Marcial Pacas, M.D. Ph.D.  [facility] [address], [place], [place] [address] [phone]

## 2013-05-18 LAB — CBC WITH DIFFERENTIAL
BASOS ABS: 0 10*3/uL (ref 0.0–0.2)
BASOS: 1 %
EOS: 4 %
Eosinophils Absolute: 0.2 10*3/uL (ref 0.0–0.4)
HEMATOCRIT: 37 % — AB (ref 37.5–51.0)
HEMOGLOBIN: 11.9 g/dL — AB (ref 12.6–17.7)
Immature Grans (Abs): 0 10*3/uL (ref 0.0–0.1)
Immature Granulocytes: 0 %
LYMPHS: 27 %
Lymphocytes Absolute: 1.3 10*3/uL (ref 0.7–3.1)
MCH: 27.2 pg (ref 26.6–33.0)
MCHC: 32.2 g/dL (ref 31.5–35.7)
MCV: 85 fL (ref 79–97)
MONOCYTES: 16 %
Monocytes Absolute: 0.8 10*3/uL (ref 0.1–0.9)
NEUTROS ABS: 2.6 10*3/uL (ref 1.4–7.0)
Neutrophils Relative %: 52 %
Platelets: 243 10*3/uL (ref 150–379)
RBC: 4.38 x10E6/uL (ref 4.14–5.80)
RDW: 16.3 % — ABNORMAL HIGH (ref 12.3–15.4)
WBC: 4.9 10*3/uL (ref 3.4–10.8)

## 2013-05-18 LAB — COMPREHENSIVE METABOLIC PANEL
ALBUMIN: 3.9 g/dL (ref 3.5–4.8)
ALT: 18 IU/L (ref 0–44)
AST: 24 IU/L (ref 0–40)
Albumin/Globulin Ratio: 1.6 (ref 1.1–2.5)
Alkaline Phosphatase: 106 IU/L (ref 39–117)
BUN/Creatinine Ratio: 29 — ABNORMAL HIGH (ref 10–22)
BUN: 29 mg/dL — ABNORMAL HIGH (ref 8–27)
CHLORIDE: 102 mmol/L (ref 97–108)
CO2: 27 mmol/L (ref 18–29)
Calcium: 9.4 mg/dL (ref 8.6–10.2)
Creatinine, Ser: 1 mg/dL (ref 0.76–1.27)
GFR calc Af Amer: 82 mL/min/{1.73_m2} (ref >59)
GFR, EST NON AFRICAN AMERICAN: 71 mL/min/{1.73_m2} (ref >59)
GLUCOSE: 97 mg/dL (ref 65–99)
Globulin, Total: 2.4 g/dL (ref 1.5–4.5)
Potassium: 4.7 mmol/L (ref 3.5–5.2)
Sodium: 141 mmol/L (ref 134–144)
TOTAL PROTEIN: 6.3 g/dL (ref 6.0–8.5)
Total Bilirubin: 1 mg/dL (ref 0.0–1.2)

## 2013-05-18 LAB — HGB A1C W/O EAG: Hgb A1c MFr Bld: 7.5 % — ABNORMAL HIGH (ref 4.8–5.6)

## 2013-05-18 LAB — THYROID PANEL WITH TSH
Free Thyroxine Index: 1.3 (ref 1.2–4.9)
T3 UPTAKE RATIO: 38 % (ref 24–39)
T4 TOTAL: 3.5 ug/dL — AB (ref 4.5–12.0)
TSH: 4.36 u[IU]/mL (ref 0.450–4.500)

## 2013-05-18 LAB — C-REACTIVE PROTEIN: CRP: 1.5 mg/L (ref 0.0–4.9)

## 2013-05-18 LAB — VITAMIN B12: Vitamin B-12: 374 pg/mL (ref 211–946)

## 2013-05-18 LAB — SEDIMENTATION RATE: Sed Rate: 8 mm/h (ref 0–30)

## 2013-05-18 LAB — ANA W/REFLEX IF POSITIVE: Anti Nuclear Antibody(ANA): NEGATIVE

## 2013-05-18 LAB — FOLATE: Folate: 14.5 ng/mL (ref >3.0)

## 2013-06-02 ENCOUNTER — Telehealth: Payer: Self-pay | Admitting: *Deleted

## 2013-06-02 NOTE — Telephone Encounter (Addendum)
I spoke to Mr. David Ayala again.   He stated that Jake Samples was old friend of pt.  He is the Emergency Contact for pt.  (as well as Clydie Braun his wife [phone]).  He is to bring POA (copy) for [place] to scan to system.   He stated has moved and is packed.  Once found will bring.

## 2013-06-02 NOTE — Telephone Encounter (Signed)
POA, Richard Biddy called.  Asking about PT and MRI that pt was ordered when in to see Dr. Terrace Arabia on 05-17-13.  I called and spoke to [facility] and asked if order there, phone # they had previously not working.  I did give POA # to them [phone] for contact information.  Tobias Alexander , emergency contact [phone] for this pt  (I called and her caregiver, Suezanne Cheshire, stated Jake Samples has short term memory and would relay the information to Athena's daughter to call me back.  I faxed to [facility] [phone] order for PT, [phone].

## 2013-06-06 ENCOUNTER — Ambulatory Visit
Admission: RE | Admit: 2013-06-06 | Discharge: 2013-06-06 | Disposition: A | Discharge status: Home / Self Care | Payer: Medicare Other | Source: Ambulatory Visit | Attending: Neurology | Admitting: Neurology

## 2013-06-06 DIAGNOSIS — F988 Other specified behavioral and emotional disorders with onset usually occurring in childhood and adolescence: Secondary | ICD-10-CM

## 2013-06-06 DIAGNOSIS — R413 Other amnesia: Secondary | ICD-10-CM

## 2013-06-06 DIAGNOSIS — R269 Unspecified abnormalities of gait and mobility: Secondary | ICD-10-CM

## 2013-06-08 ENCOUNTER — Telehealth: Payer: Self-pay | Admitting: Neurology

## 2013-06-08 NOTE — Telephone Encounter (Signed)
David Ayala, Please call patient, MRI brain showed age related changes,

## 2013-06-09 NOTE — Telephone Encounter (Signed)
Spoke to patient's POA, Richard Biddy, and relayed MRI brain results, per Dr. Terrace Arabia.  POA would like lab results called to [facility] at [phone].

## 2013-06-10 NOTE — Telephone Encounter (Signed)
David Ayala, please call, lab showed mildly decreased total T4, anemia 11 point 9, elevated A1c 7.5,  I have faxed the results to his primary care, he needs better diabetes control,

## 2013-06-14 NOTE — Telephone Encounter (Signed)
Spoke to patient's nurse at [facility] and relayed lab results, per Dr. Terrace Arabia.  I have also faxed a copy to her at [phone] and received confirmation.

## 2013-06-18 ENCOUNTER — Telehealth: Payer: Self-pay | Admitting: Neurology

## 2013-06-18 NOTE — Telephone Encounter (Signed)
Richard, pt's friend calling requesting pt's lab result and wanted to know if pt's medication needed to be adjusted. Please advise

## 2013-06-18 NOTE — Telephone Encounter (Signed)
Calling for the patients lab results and to find out if there are going to be adjustments made to his medication.

## 2013-06-18 NOTE — Telephone Encounter (Signed)
Please call patient, laboratory showed normal vitamin B12, thyroid functional test, C. reactive protein,  ANA, elevated A1c 7.5, mild anemia, he needs to contact his primary care physician for better diabetes control, otherwise no adjust medications

## 2013-06-21 NOTE — Telephone Encounter (Signed)
Called pt's POA Richard Biddy to inform him per Dr. Terrace ArabiaYan that the pt's lab results showed normal vitamin B12, Thyroid functional test, C. Reactive protein, ANA, elevated A1c 7.5, mild anemia, pt needs to contact his PCP for better diabetes control, other wise no adjustment on medications. I advised Richard that if the pt has any other problems, questions or concerns to call the office. Richard verbalized understanding.

## 2013-07-19 ENCOUNTER — Encounter (INDEPENDENT_AMBULATORY_CARE_PROVIDER_SITE_OTHER): Payer: Self-pay

## 2013-07-19 ENCOUNTER — Encounter: Payer: Self-pay | Admitting: Neurology

## 2013-07-19 ENCOUNTER — Ambulatory Visit (INDEPENDENT_AMBULATORY_CARE_PROVIDER_SITE_OTHER): Payer: PRIVATE HEALTH INSURANCE | Admitting: Neurology

## 2013-07-19 VITALS — BP 96/64 | HR 89 | Ht 71.0 in | Wt 189.0 lb

## 2013-07-19 DIAGNOSIS — L0390 Cellulitis, unspecified: Secondary | ICD-10-CM

## 2013-07-19 DIAGNOSIS — F988 Other specified behavioral and emotional disorders with onset usually occurring in childhood and adolescence: Secondary | ICD-10-CM

## 2013-07-19 DIAGNOSIS — L0291 Cutaneous abscess, unspecified: Secondary | ICD-10-CM

## 2013-07-19 DIAGNOSIS — G609 Hereditary and idiopathic neuropathy, unspecified: Secondary | ICD-10-CM

## 2013-07-19 DIAGNOSIS — R413 Other amnesia: Secondary | ICD-10-CM

## 2013-07-19 MED ORDER — MEMANTINE HCL-DONEPEZIL HCL ER 14-10 MG PO CP24: 1.0000 | ORAL_CAPSULE | Freq: Every day | ORAL | Status: DC

## 2013-07-19 NOTE — Progress Notes (Signed)
PATIENT: David Ayala DOB: 03-16-1931  HISTORICAL  Dorothy Spark is referred by his primary care Dr. Ophelia Charter with POA friend of more than 40 years, Richard Biddy.at today's visit for  memory trouble, confusion, decreased mobility. He currently lives at [facility], has been there for one year, assistant living for 5 years.  Per referring material, he had past medical history of hypertension, coronary artery disease, congestive heart failure, peripheral vascular disease, diabetes since 2007, prostate hypertrophy, depression, shingles,. 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,  We have reviewed CAT scan in April 2015, showed significant atrophy, periventricular small vessel disease, this was taken because of his increased confusion, he also reported a strokelike event, but could not elaborate on details,   UPDATE July 19 2013:  He continue to complain gait difficulty, mild memory trouble, laboratory evaluation showed normal B12, TSH, elevated A1c 7.4, mild anemia 11.9, I have given him a copy of laboratory evaluation, you will continue followup with his primary care physician to address diabetes, and anemia,  We also reviewed MRI of the brain, which showed moderate atrophy, mild small vessel disease   REVIEW OF SYSTEMS: Full 14 system review of systems performed and notable only for memory loss, gait difficulty  ALLERGIES: Allergies    Allergen Reactions  . Penicillins     HOME MEDICATIONS: Current Outpatient Prescriptions on File Prior to Visit  Medication Sig Dispense Refill  . albuterol (PROVENTIL) (2.5 MG/3ML) 0.083% nebulizer solution Take 2.5 mg by nebulization every 6 (six) hours as needed for wheezing or shortness of breath.      Marland Kitchen aspirin 81 MG tablet Take 81 mg by mouth daily.      . Cholecalciferol (VITAMIN D3) 50000 UNITS CAPS Take by mouth daily.      . DULoxetine (CYMBALTA) 60 MG capsule Take 60 mg by mouth daily.      . famotidine (PEPCID) 40 MG tablet Take 40 mg by mouth daily.      . furosemide (LASIX) 10 MG/ML solution Take by mouth daily.      . Hydrocodone-Acetaminophen (VICODIN) 5-300 MG TABS Take by mouth as needed.      . insulin glargine (LANTUS) 100 UNIT/ML injection Inject into the skin at bedtime.      . lidocaine (LIDODERM) 5 % Place 1 patch onto the skin daily. Remove & Discard patch within 12 hours or as directed by MD      . Menthol, Topical Analgesic, (BIOFREEZE) 4 % GEL Apply topically as needed.      . nitroGLYCERIN (NITROLINGUAL) 0.4 MG/SPRAY spray Place 1 spray under the tongue every 5 (five) minutes x 3 doses as needed for chest pain.      Marland Kitchen senna (SENOKOT) 176 MG/5ML SYRP Take by mouth.      . vitamin B-12 (CYANOCOBALAMIN) 1000 MCG tablet Take 1,000 mcg by mouth daily.       No current facility-administered medications on file prior  to visit.    PAST MEDICAL HISTORY: Past Medical History  Diagnosis Date  . Gait abnormality   . Memory change   . COPD (chronic obstructive pulmonary disease)   . Diabetes   . ADD (attention deficit disorder)     PAST SURGICAL HISTORY:  Diverticulitis, hemicolectomy, 1976   FAMILY HISTORY: Family History  Problem Relation Age of Onset  . Heart Problems Mother     SOCIAL HISTORY:  History   Social History  . Marital Status: Divorced    Spouse Name: N/A    Number of Children: 4  . Years of Education: college   Occupational  History    Retired for Armed forces operational officer, he retried at age 80.   Social History Main Topics  . Smoking status: Former Games developer  . Smokeless tobacco: Never Used     Comment: Quit 25 years ago.  . Alcohol Use: 0.6 oz/week    1 Shots of liquor per week     Comment: OCC.  . Drug Use: No  . Sexual Activity: Not on file   Other Topics Concern  . Not on file   Social History Narrative   Patient lives in [facility] assisted [facility].   Retired.   Education college   Right handed.   Caffeine None       Patient was with his POA Richard Biddy [phone].     PHYSICAL EXAM   Filed Vitals:   07/19/13 1059  BP: 96/64  Pulse: 89  Height: 5\' 11"  (1.803 m)  Weight: 189 lb (85.73 kg)    Not recorded    Body mass index is 26.37 kg/(m^2).   Generalized: In no acute distress  Neck: Supple, no carotid bruits   Cardiac: Regular rate rhythm  Pulmonary: Clear to auscultation bilaterally  Musculoskeletal: No deformity  Neurological examination  Mentation: Alert oriented to time, place, history taking, and causual conversation, Mini-Mental Status Examination is 26 out of 30, he has missed 2 out of 3 recalls, not oriented to date and season  Cranial nerve II-XII: Pupils were equal round reactive to light. Extraocular movements were full.  Visual field were full on confrontational test. Bilateral fundi were sharp.  Facial sensation and strength were normal. Hearing was intact to finger rubbing bilaterally. Uvula tongue midline.  Head turning and shoulder shrug and were normal and symmetric.Tongue protrusion into cheek strength was normal.  Motor: Normal tone, bulk and strength.  Sensory: Skin discoloration at the distal leg, length dependent decreased  fine touch, pinprick to mid shin level   Coordination: Normal finger to nose, heel-to-shin bilaterally there was no truncal ataxia  Gait: Rising up from seated position by pushing on chair arm, cautious wide-based unsteady gait    Romberg signs: Negative  Deep tendon reflexes: Brachioradialis 2/2, biceps 2/2, triceps 2/2, patellar 2/2, Achilles  trace, plantar responses were flexor bilaterally.   DIAGNOSTIC DATA (LABS, IMAGING, TESTING) - I reviewed patient records, labs, notes, testing and imaging myself where available.  Lab Results  Component Value Date   WBC 4.9 05/17/2013   HGB 11.9* 05/17/2013   HCT 37.0* 05/17/2013   MCV 85 05/17/2013   PLT 243 05/17/2013      Component Value Date/Time   NA 141 05/17/2013 1042   NA 130* 03/02/2007 2118   K 4.7 05/17/2013 1042   CL 102 05/17/2013 1042   CO2 27 05/17/2013 1042   GLUCOSE 97 05/17/2013 1042   GLUCOSE 405* 03/02/2007 2118   BUN 29* 05/17/2013 1042  BUN 20 03/02/2007 2118   CREATININE 1.00 05/17/2013 1042   CALCIUM 9.4 05/17/2013 1042   PROT 6.3 05/17/2013 1042   AST 24 05/17/2013 1042   ALT 18 05/17/2013 1042   ALKPHOS 106 05/17/2013 1042   BILITOT 1.0 05/17/2013 1042   GFRNONAA 71 05/17/2013 1042   GFRAA 82 05/17/2013 1042    ASSESSMENT AND PLAN  Dorothy SparkGeorge D Fesenko-NAVROTSKY is a 80 y.o. male complains of  few years history of rapid onset memory trouble, he has vascular risk factor of hypertension, previous heavy smoker, previous poorly controlled diabetes, coronary artery disease, peripheral vascular disease, questionable history of stroke, there was no family history of dementia, we have reviewed MRI of the brain, there was evidence of moderate atrophy, mild small vessel disease, no acute lesions  1. his complains of memory trouble most likely due to combination of aging, mild cognitive impairment, could be early central nervous system degenerative disorder, 2.  Will add on Namzaric one po qday. 3. RTC in 6 months  Levert FeinsteinYijun Blandina Renaldo, M.D. Ph.D.  [facility] [address], [address] [place], [place] [address] [phone]

## 2013-10-06 ENCOUNTER — Non-Acute Institutional Stay (SKILLED_NURSING_FACILITY): Payer: PRIVATE HEALTH INSURANCE | Admitting: Internal Medicine

## 2013-10-06 DIAGNOSIS — N182 Chronic kidney disease, stage 2 (mild): Secondary | ICD-10-CM

## 2013-10-06 DIAGNOSIS — L98499 Non-pressure chronic ulcer of skin of other sites with unspecified severity: Secondary | ICD-10-CM

## 2013-10-06 DIAGNOSIS — I739 Peripheral vascular disease, unspecified: Secondary | ICD-10-CM

## 2013-10-06 DIAGNOSIS — F0150 Vascular dementia without behavioral disturbance: Secondary | ICD-10-CM

## 2013-10-06 DIAGNOSIS — E1149 Type 2 diabetes mellitus with other diabetic neurological complication: Secondary | ICD-10-CM

## 2013-10-06 DIAGNOSIS — E1142 Type 2 diabetes mellitus with diabetic polyneuropathy: Secondary | ICD-10-CM

## 2013-10-06 DIAGNOSIS — I70209 Unspecified atherosclerosis of native arteries of extremities, unspecified extremity: Secondary | ICD-10-CM

## 2013-10-06 NOTE — Progress Notes (Signed)
MRN: [id number] Name: David Ayala  Sex: male Age: 80 y.o. DOB: 11-04-1931  [name] Facility/Room:425D Level Of Care: SNF Provider: Merrilee Seashore D Emergency Contacts: Extended Emergency Contact Information Primary Emergency Contact: Biddy,Richard  [place] Mobile Phone: [phone] Relation: None  Code Status: DNR  Allergies: Penicillins  Chief Complaint  Patient presents with  . Medical Management of Chronic Issues    HPI: Patient is 80 y.o. male who is being seen for routine issues.  Past Medical History  Diagnosis Date  . Gait abnormality   . Memory change   . COPD (chronic obstructive pulmonary disease)   . Diabetes   . ADD (attention deficit disorder)     History reviewed. No pertinent past surgical history.    Medication List       This list is accurate as of: 10/06/13 11:59 PM.  Always use your most recent med list.               albuterol (2.5 MG/3ML) 0.083% nebulizer solution  Commonly known as:  PROVENTIL  Take 2.5 mg by nebulization every 6 (six) hours as needed for wheezing or shortness of breath.     aspirin 81 MG tablet  Take 81 mg by mouth daily.     BIOFREEZE 4 % Gel  Generic drug:  Menthol (Topical Analgesic)  Apply topically as needed.     DULoxetine 60 MG capsule  Commonly known as:  CYMBALTA  Take 60 mg by mouth daily.     famotidine 40 MG tablet  Commonly known as:  PEPCID  Take 40 mg by mouth daily.     furosemide 10 MG/ML solution  Commonly known as:  LASIX  Take by mouth daily.     insulin glargine 100 UNIT/ML injection  Commonly known as:  LANTUS  Inject into the skin at bedtime.     lidocaine 5 %  Commonly known as:  LIDODERM  Place 1 patch onto the skin daily. Remove & Discard patch within 12 hours or as directed by MD     lisinopril 5 MG tablet  Commonly known as:  PRINIVIL,ZESTRIL  Take 5 mg by mouth daily.     Memantine HCl-Donepezil HCl 14-10 MG Cp24  Commonly  known as:  NAMZARIC  Take 1 tablet by mouth daily.     nitroGLYCERIN 0.4 MG/SPRAY spray  Commonly known as:  NITROLINGUAL  Place 1 spray under the tongue every 5 (five) minutes x 3 doses as needed for chest pain.     senna 176 MG/5ML Syrp  Commonly known as:  SENOKOT  Take by mouth.     VICODIN 5-300 MG Tabs  Generic drug:  Hydrocodone-Acetaminophen  Take by mouth as needed.     vitamin B-12 1000 MCG tablet  Commonly known as:  CYANOCOBALAMIN  Take 1,000 mcg by mouth daily.     Vitamin D3 50000 UNITS Caps  Take by mouth daily.        Meds ordered this encounter  Medications  . lisinopril (PRINIVIL,ZESTRIL) 5 MG tablet    Sig: Take 5 mg by mouth daily.    Immunization History  Administered Date(s) Administered  . Influenza Whole 10/20/2012    History  Substance Use Topics  . Smoking status: Former Games developer  . Smokeless tobacco: Never Used     Comment: Quit 25 years ago.  . Alcohol Use: 0.6 oz/week    1 Shots of liquor per week     Comment: OCC.  Review of Systems  DATA OBTAINED: from patient GENERAL:  no fevers, fatigue, appetite changes SKIN: No itching, rash HEENT: No complaint RESPIRATORY: No cough, wheezing, SOB CARDIAC: No chest pain, palpitations, lower extremity edema  GI: No abdominal pain, No N/V/D or constipation, No heartburn or reflux  GU: No dysuria, frequency or urgency, or incontinence  MUSCULOSKELETAL: No unrelieved bone/joint pain NEUROLOGIC: No headache, dizziness  PSYCHIATRIC: No overt anxiety or sadness  Filed Vitals:   10/06/13 1526  BP: 111/72  Pulse: 87  Temp: 97.5 F (36.4 C)  Resp: 18    Physical Exam  GENERAL APPEARANCE: Alert, conversant, No acute distress  SKIN: No diaphoresis rash; BLE wrapped in gling guaze HEENT: Unremarkable RESPIRATORY: Breathing is even, unlabored. Lung sounds are clear   CARDIOVASCULAR: Heart RRR  3/6 systolic murmur, rubs or gallops. Trace LLE peripheral edema  GASTROINTESTINAL: Abdomen is  soft, non-tender, not distended w/ normal bowel sounds.  GENITOURINARY: Bladder non tender, not distended  MUSCULOSKELETAL: No abnormal joints or musculature NEUROLOGIC: Cranial nerves 2-12 grossly intact. Moves all extremities PSYCHIATRIC: Mood and affect appropriate to situation, no behavioral issues  Patient Active Problem List   Diagnosis Date Noted  . CKD (chronic kidney disease) stage 2, GFR 60-89 ml/min 10/10/2013  . Atherosclerotic peripheral vascular disease with ulceration 10/10/2013  . Vascular dementia without behavioral disturbance 10/10/2013  . Memory change   . Gait abnormality   . ADD (attention deficit disorder)   . Influenza due to identified novel influenza A virus with other respiratory manifestations 01/19/2013  . Cellulitis and abscess of unspecified site 11/25/2012  . Type 2 diabetes mellitus with neurological complications 11/25/2012  . Type II or unspecified type diabetes mellitus with neurological manifestations, not stated as uncontrolled(250.60) 08/29/2012  . Chronic airway obstruction, not elsewhere classified 08/29/2012  . Diabetic peripheral neuropathy associated with type 2 diabetes mellitus 08/29/2012  . Congestive heart failure, unspecified 08/29/2012    CBC    Component Value Date/Time   WBC 4.9 05/17/2013 1042   WBC 6.4 03/02/2007 2118   RBC 4.38 05/17/2013 1042   RBC 3.55* 03/02/2007 2118   HGB 11.9* 05/17/2013 1042   HCT 37.0* 05/17/2013 1042   PLT 243 05/17/2013 1042   MCV 85 05/17/2013 1042   LYMPHSABS 1.3 05/17/2013 1042   LYMPHSABS 2.0 03/02/2007 2118   MONOABS 0.5 03/02/2007 2118   EOSABS 0.2 05/17/2013 1042   EOSABS 0.1 03/02/2007 2118   BASOSABS 0.0 05/17/2013 1042   BASOSABS 0.1 03/02/2007 2118    CMP     Component Value Date/Time   NA 141 05/17/2013 1042   NA 130* 03/02/2007 2118   K 4.7 05/17/2013 1042   CL 102 05/17/2013 1042   CO2 27 05/17/2013 1042   GLUCOSE 97 05/17/2013 1042   GLUCOSE 405* 03/02/2007 2118   BUN 29* 05/17/2013 1042    BUN 20 03/02/2007 2118   CREATININE 1.00 05/17/2013 1042   CALCIUM 9.4 05/17/2013 1042   PROT 6.3 05/17/2013 1042   AST 24 05/17/2013 1042   ALT 18 05/17/2013 1042   ALKPHOS 106 05/17/2013 1042   BILITOT 1.0 05/17/2013 1042   GFRNONAA 71 05/17/2013 1042   GFRAA 82 05/17/2013 1042    Assessment and Plan  Type 2 diabetes mellitus with neurological complications A1c 7.5 in 05/2013; pt on lantus and novolog and ACE restarted per cards  CKD (chronic kidney disease) stage 2, GFR 60-89 ml/min GFR 76 and CrCl 75 in 07/2013; lasix and lisinopril restarted per cards in 08/2013.  Atherosclerotic peripheral vascular disease with ulceration Shallow ulcerations to BLE ; wound care MD and nurse following  Diabetic peripheral neuropathy associated with type 2 diabetes mellitus Pt no longer on neurontin 2/2 no pain currently;on cymbalta 60 mg  Vascular dementia without behavioral disturbance Stable on namenda and cymbalta; engaging and talkative    Margit Hanks, MD

## 2013-10-10 ENCOUNTER — Encounter: Payer: Self-pay | Admitting: Internal Medicine

## 2013-10-10 DIAGNOSIS — F0150 Vascular dementia without behavioral disturbance: Secondary | ICD-10-CM | POA: Insufficient documentation

## 2013-10-10 DIAGNOSIS — I70209 Unspecified atherosclerosis of native arteries of extremities, unspecified extremity: Secondary | ICD-10-CM | POA: Insufficient documentation

## 2013-10-10 DIAGNOSIS — N182 Chronic kidney disease, stage 2 (mild): Secondary | ICD-10-CM | POA: Insufficient documentation

## 2013-10-10 DIAGNOSIS — L98499 Non-pressure chronic ulcer of skin of other sites with unspecified severity: Secondary | ICD-10-CM

## 2013-10-10 NOTE — Assessment & Plan Note (Addendum)
A1c 7.5 in 05/2013; pt on lantus and novolog and ACE restarted per cards

## 2013-10-10 NOTE — Assessment & Plan Note (Signed)
GFR 76 and CrCl 75 in 07/2013; lasix and lisinopril restarted per cards in 08/2013.

## 2013-10-10 NOTE — Assessment & Plan Note (Signed)
Shallow ulcerations to BLE ; wound care MD and nurse following

## 2013-10-10 NOTE — Assessment & Plan Note (Signed)
Pt no longer on neurontin 2/2 no pain currently;on cymbalta 60 mg

## 2013-10-10 NOTE — Assessment & Plan Note (Addendum)
Stable on namenda and cymbalta; engaging and talkative

## 2013-10-21 ENCOUNTER — Encounter (INDEPENDENT_AMBULATORY_CARE_PROVIDER_SITE_OTHER): Payer: Self-pay

## 2013-10-21 ENCOUNTER — Ambulatory Visit (INDEPENDENT_AMBULATORY_CARE_PROVIDER_SITE_OTHER): Payer: PRIVATE HEALTH INSURANCE | Admitting: Adult Health

## 2013-10-21 ENCOUNTER — Encounter: Payer: Self-pay | Admitting: Adult Health

## 2013-10-21 VITALS — BP 119/80 | HR 95 | Ht 72.0 in | Wt 190.0 lb

## 2013-10-21 DIAGNOSIS — R269 Unspecified abnormalities of gait and mobility: Secondary | ICD-10-CM

## 2013-10-21 DIAGNOSIS — R413 Other amnesia: Secondary | ICD-10-CM

## 2013-10-21 NOTE — Patient Instructions (Signed)

## 2013-10-21 NOTE — Progress Notes (Signed)
PATIENT: David Ayala DOB: 12-Sep-1931  REASON FOR VISIT: follow up HISTORY FROM: patient  HISTORY OF PRESENT ILLNESS: David Ayala is a 80 year old male with a history of memory loss. He returns today for follow-up. He is currently taking Namzaric and tolerating it well. He reports that his memory has gotten better. His POA is here with him today and he reports that his memory initially gotten better but in the last several weeks it may have declined some. He lives at [facility]. Requires minimal assistance with ADLS. He states that he typically can not remember what he did the day before. He has no problem with remembering things 60 years ago. He does tend to forget names and numbers. He often likes to draw things. 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 Ophelia Charter with POA friend of more than 40 years, Richard Biddy.at today's visit for memory trouble, confusion, decreased mobility. He currently lives at [facility], has been there for one year, assistant living for 5 years.  Per referring material, he had past medical history of hypertension, coronary artery disease, congestive heart failure, peripheral vascular disease, diabetes since 2007, prostate hypertrophy, depression, shingles,. COPD, uses home oxygen,  He retired from Armed forces operational officer at 69, he has lived in assisted living since 2010, because he was living by himself, was not able to take care of himself, his diabetes was out of control, he had fell few times before he was placed to assistant living  He is now having gait difficulty, still design greeting cards, but did describe difficulty focus, lack of concentration, tends to forget names,  He immigrant from [place] at age 77, was able to talk for different kind of language, is  writing books about his life experience,  We have reviewed CAT scan in April 2015, showed significant atrophy, periventricular small vessel disease, this was taken because of his increased confusion, he also reported a strokelike event, but could not elaborate on details,  UPDATE July 19 2013:  He continue to complain gait difficulty, mild memory trouble, laboratory evaluation showed normal B12, TSH, elevated A1c 7.4, mild anemia 11.9, I have given him a copy of laboratory evaluation, you will continue followup with his primary care physician to address diabetes, and anemia,  We also reviewed MRI of the brain, which showed moderate atrophy, mild small vessel disease  REVIEW OF SYSTEMS: Full 14 system review of systems performed and notable only for:  Constitutional: N/A  Eyes: N/A Ear/Nose/Throat: N/A  Skin: N/A  Cardiovascular: N/A  Respiratory: N/A  Gastrointestinal: N/A  Genitourinary: N/A Hematology/Lymphatic: N/A  Endocrine: N/A Musculoskeletal:N/A  Allergy/Immunology: N/A  Neurological: memory loss Psychiatric: N/A Sleep: N/A   ALLERGIES: Allergies  Allergen Reactions  . Penicillins     HOME MEDICATIONS: Outpatient Prescriptions Prior to Visit  Medication Sig Dispense Refill  . albuterol (PROVENTIL) (2.5 MG/3ML) 0.083% nebulizer solution Take 2.5 mg by nebulization every 6 (six) hours as needed for wheezing or shortness of breath.      Marland Kitchen aspirin 81 MG tablet Take 81 mg by mouth daily.      . Cholecalciferol (VITAMIN D3) 50000 UNITS CAPS Take by mouth daily.      . DULoxetine (CYMBALTA) 60 MG capsule Take 60 mg by mouth daily.      . famotidine (  PEPCID) 40 MG tablet Take 40 mg by mouth daily.      . furosemide (LASIX) 10 MG/ML solution Take by mouth daily.      . Hydrocodone-Acetaminophen (VICODIN) 5-300 MG TABS Take by mouth as needed.      . insulin glargine (LANTUS) 100 UNIT/ML injection Inject into the skin at bedtime.      . lidocaine (LIDODERM) 5 % Place 1 patch  onto the skin daily. Remove & Discard patch within 12 hours or as directed by MD      . lisinopril (PRINIVIL,ZESTRIL) 5 MG tablet Take 5 mg by mouth daily.      . Memantine HCl-Donepezil HCl (NAMZARIC) 14-10 MG CP24 Take 1 tablet by mouth daily.  30 capsule  12  . Menthol, Topical Analgesic, (BIOFREEZE) 4 % GEL Apply topically as needed.      . nitroGLYCERIN (NITROLINGUAL) 0.4 MG/SPRAY spray Place 1 spray under the tongue every 5 (five) minutes x 3 doses as needed for chest pain.      Marland Kitchen. senna (SENOKOT) 176 MG/5ML SYRP Take by mouth.      . vitamin B-12 (CYANOCOBALAMIN) 1000 MCG tablet Take 1,000 mcg by mouth daily.       No facility-administered medications prior to visit.    PAST MEDICAL HISTORY: Past Medical History  Diagnosis Date  . Gait abnormality   . Memory change   . COPD (chronic obstructive pulmonary disease)   . Diabetes   . ADD (attention deficit disorder)     PAST SURGICAL HISTORY: History reviewed. No pertinent past surgical history.  FAMILY HISTORY: Family History  Problem Relation Age of Onset  . Heart Problems Mother     SOCIAL HISTORY: History   Social History  . Marital Status: Divorced    Spouse Name: N/A    Number of Children: 4  . Years of Education: college   Occupational History  .      Retired   Social History Main Topics  . Smoking status: Former Games developermoker  . Smokeless tobacco: Never Used     Comment: Quit 25 years ago.  . Alcohol Use: 0.6 oz/week    1 Shots of liquor per week     Comment: OCC.  . Drug Use: No  . Sexual Activity: Not on file   Other Topics Concern  . Not on file   Social History Narrative   [facility].   Retired.   Education college   Right handed.   Caffeine None       Patient was with his POA Richard Biddy 336 [phone].      PHYSICAL EXAM  Filed Vitals:   10/21/13 1313  BP: 119/80  Pulse: 95  Height: 6' (1.829 m)  Weight: 190 lb (86.183 kg)   Body mass index is 25.76  kg/(m^2).  Generalized: Well developed, in no acute distress   Neurological examination  Mentation: Alert oriented to time, place, history taking. Follows all commands speech and language fluent. MMSE 25/30 Cranial nerve II-XII: Pupils were equal round reactive to light. Extraocular movements were full, visual field were full on confrontational test. Facial sensation and strength were normal.Uvula tongue midline. Head turning and shoulder shrug  were normal and symmetric. Motor: The motor testing reveals 5 over 5 strength of all 4 extremities. Good symmetric motor tone is noted throughout.  Sensory: Sensory testing is intact to soft touch on all 4 extremities. No evidence of extinction is noted.  Coordination: Cerebellar testing reveals  good finger-nose-finger and heel-to-shin bilaterally.  Gait and station: Uses a wheelchair today. Able to stand by pushing off chair. Shuffling gait, uses a cane. Tandem gait not attempted. Romberg is negative. No drift is seen.    DIAGNOSTIC DATA (LABS, IMAGING, TESTING) - I reviewed patient records, labs, notes, testing and imaging myself where available.  Lab Results  Component Value Date   WBC 4.9 05/17/2013   HGB 11.9* 05/17/2013   HCT 37.0* 05/17/2013   MCV 85 05/17/2013   PLT 243 05/17/2013      Component Value Date/Time   NA 141 05/17/2013 1042   NA 130* 03/02/2007 2118   K 4.7 05/17/2013 1042   CL 102 05/17/2013 1042   CO2 27 05/17/2013 1042   GLUCOSE 97 05/17/2013 1042   GLUCOSE 405* 03/02/2007 2118   BUN 29* 05/17/2013 1042   BUN 20 03/02/2007 2118   CREATININE 1.00 05/17/2013 1042   CALCIUM 9.4 05/17/2013 1042   PROT 6.3 05/17/2013 1042   AST 24 05/17/2013 1042   ALT 18 05/17/2013 1042   ALKPHOS 106 05/17/2013 1042   BILITOT 1.0 05/17/2013 1042   GFRNONAA 71 05/17/2013 1042   GFRAA 82 05/17/2013 1042    Lab Results  Component Value Date   HGBA1C 7.5* 05/17/2013   Lab Results  Component Value Date   VITAMINB12 374 05/17/2013   Lab Results    Component Value Date   TSH 4.360 05/17/2013      ASSESSMENT AND PLAN 80 y.o. year old male  has a past medical history of Gait abnormality; Memory change; COPD (chronic obstructive pulmonary disease); Diabetes; and ADD (attention deficit disorder). here with:  1. Memory loss 2. Abnormality of gait  Patient's memory has remained stable on Namzaric. Patient and his POA did notice that the Namzaric boosted his memory initially. He will continue Namzaric. He gait has remained the same. I feel that he would benefit from PT for gait and balance training. Patient also states that if he wakes up during the night he becomes disoriented sometimes. He feels this is due to the fact there is no window in his room and he can't distinguish if it is night or day. I have written these request on his report form from [facility].   Butch PennyMegan Sherryn Pollino, MSN, NP-C 10/21/2013, 1:19 PM [facility] [address], [address] [place], [place] [address] [phone]  Note: This document was prepared with digital dictation and possible smart phrase technology. Any transcriptional errors that result from this process are unintentional.

## 2013-10-22 ENCOUNTER — Telehealth: Payer: Self-pay | Admitting: Adult Health

## 2013-10-22 NOTE — Telephone Encounter (Signed)
I called and spoke with Lauren. Her administrator was concerned by the comments I placed on the consultation form. They are addressing the issues that the patient has but they do not want it on the medical record. I explain that is the form that send for us to communicate with them and that's why it was written there. She asked if I could just send a note next time. I agreed to that.

## 2013-10-22 NOTE — Telephone Encounter (Signed)
Please see phone note  

## 2013-10-22 NOTE — Telephone Encounter (Signed)
Lauren with [facility] @ [phone], calling regarding consultation sheet received back from OV.  Administrator concerned about comments on sheet, stated patient complained that facility wasn't helping with his needs.  Also requested patient have window in room due to dementia and disorientation after waking during the night.  Patient has window in room and was offered bed next to window, this is concerning due to this would be part of patients medical records at facility.  Please call and advise.

## 2013-11-24 ENCOUNTER — Encounter: Payer: Self-pay | Admitting: Neurology

## 2013-11-30 ENCOUNTER — Encounter: Payer: Self-pay | Admitting: Neurology

## 2014-01-15 ENCOUNTER — Non-Acute Institutional Stay (SKILLED_NURSING_FACILITY): Payer: Medicare Other | Admitting: Internal Medicine

## 2014-01-15 DIAGNOSIS — L98499 Non-pressure chronic ulcer of skin of other sites with unspecified severity: Secondary | ICD-10-CM

## 2014-01-15 DIAGNOSIS — I70209 Unspecified atherosclerosis of native arteries of extremities, unspecified extremity: Secondary | ICD-10-CM

## 2014-01-15 DIAGNOSIS — I482 Chronic atrial fibrillation, unspecified: Secondary | ICD-10-CM

## 2014-01-15 DIAGNOSIS — D519 Vitamin B12 deficiency anemia, unspecified: Secondary | ICD-10-CM

## 2014-01-15 DIAGNOSIS — J439 Emphysema, unspecified: Secondary | ICD-10-CM

## 2014-01-15 DIAGNOSIS — G629 Polyneuropathy, unspecified: Secondary | ICD-10-CM

## 2014-01-15 DIAGNOSIS — N182 Chronic kidney disease, stage 2 (mild): Secondary | ICD-10-CM

## 2014-01-15 DIAGNOSIS — R1312 Dysphagia, oropharyngeal phase: Secondary | ICD-10-CM

## 2014-01-15 DIAGNOSIS — F0150 Vascular dementia without behavioral disturbance: Secondary | ICD-10-CM

## 2014-01-15 DIAGNOSIS — I739 Peripheral vascular disease, unspecified: Secondary | ICD-10-CM

## 2014-01-15 DIAGNOSIS — I5032 Chronic diastolic (congestive) heart failure: Secondary | ICD-10-CM

## 2014-01-15 DIAGNOSIS — E538 Deficiency of other specified B group vitamins: Secondary | ICD-10-CM

## 2014-01-15 DIAGNOSIS — E1142 Type 2 diabetes mellitus with diabetic polyneuropathy: Secondary | ICD-10-CM

## 2014-01-15 DIAGNOSIS — L03116 Cellulitis of left lower limb: Secondary | ICD-10-CM

## 2014-01-15 NOTE — Progress Notes (Signed)
MRN: [id number] Name: David Ayala  Sex: male Age: 81 y.o. DOB: [birth year]  [facility] #: [facility] Facility/Room: 425 Level Of Care: SNF Provider: Merrilee Ayala D Emergency Contacts: Extended Emergency Contact Information Primary Emergency Contact: Ayala,David  [place] Mobile Phone: [phone] Relation: None  Code Status: DNR  Allergies: Penicillins  Chief Complaint  Patient presents with  . Medical Management of Chronic Issues    HPI: Patient is 81 y.o. male who is being seen for routine issues.  Past Medical History  Diagnosis Date  . Gait abnormality   . Memory change   . COPD (chronic obstructive pulmonary disease)   . Diabetes   . ADD (attention deficit disorder)     History reviewed. No pertinent past surgical history.    Medication List       This list is accurate as of: 01/15/14 11:59 PM.  Always use your most recent med list.               acetaminophen 325 MG tablet  Commonly known as:  TYLENOL  Take 650 mg by mouth every 6 (six) hours as needed.     albuterol (2.5 MG/3ML) 0.083% nebulizer solution  Commonly known as:  PROVENTIL  Take 2.5 mg by nebulization every 6 (six) hours as needed for wheezing or shortness of breath.     aspirin 81 MG tablet  Take 81 mg by mouth daily.     BIOFREEZE 4 % Gel  Generic drug:  Menthol (Topical Analgesic)  Apply topically as needed.     bisacodyl 10 MG suppository  Commonly known as:  DULCOLAX  Place 10 mg rectally as needed for moderate constipation.     DULoxetine 60 MG capsule  Commonly known as:  CYMBALTA  Take 60 mg by mouth daily.     famotidine 40 MG tablet  Commonly known as:  PEPCID  Take 40 mg by mouth daily.     ferrous sulfate 325 (65 FE) MG tablet  Take 325 mg by mouth daily with breakfast.     insulin aspart 100 UNIT/ML injection  Commonly known as:  novoLOG  Inject into the skin 3 (three) times daily before meals. Hold if CBG less than 150.     insulin glargine 100 UNIT/ML injection  Commonly known as:  LANTUS  Inject into the skin at bedtime.     lidocaine 5 %  Commonly known as:  LIDODERM  Place 1 patch onto the skin daily. Remove & Discard patch within 12 hours or as directed by MD     lisinopril 5 MG tablet  Commonly known as:  PRINIVIL,ZESTRIL  Take 5 mg by mouth daily.     Memantine HCl-Donepezil HCl 14-10 MG Cp24  Commonly known as:  NAMZARIC  Take 1 tablet by mouth daily.     nitroGLYCERIN 0.4 MG/SPRAY spray  Commonly known as:  NITROLINGUAL  Place 1 spray under the tongue every 5 (five) minutes x 3 doses as needed for chest pain.     senna 176 MG/5ML Syrp  Commonly known as:  SENOKOT  Take by mouth.     tiotropium 18 MCG inhalation capsule  Commonly known as:  SPIRIVA  Place 18 mcg into inhaler and inhale daily.     torsemide 20 MG tablet  Commonly known as:  DEMADEX  Take 20 mg by mouth 2 (two) times daily.     VICODIN 5-300 MG Tabs  Generic drug:  Hydrocodone-Acetaminophen  Take by mouth as needed.  vitamin B-12 1000 MCG tablet  Commonly known as:  CYANOCOBALAMIN  Take 1,000 mcg by mouth daily.     vitamin C 500 MG tablet  Commonly known as:  ASCORBIC ACID  Take 500 mg by mouth daily.     Vitamin D3 50000 UNITS Caps  Take by mouth daily.        Meds ordered this encounter  Medications  . torsemide (DEMADEX) 20 MG tablet    Sig: Take 20 mg by mouth 2 (two) times daily.    Immunization History  Administered Date(s) Administered  . Influenza Whole 10/20/2012  . Influenza-Unspecified 10/19/2013    History  Substance Use Topics  . Smoking status: Former Games developermoker  . Smokeless tobacco: Never Used     Comment: Quit 25 years ago.  . Alcohol Use: 0.6 oz/week    1 Shots of liquor per week     Comment: OCC.    Review of Systems  DATA OBTAINED: from patient GENERAL:  no fevers, fatigue, appetite changes SKIN: No itching, rash HEENT: No complaint RESPIRATORY: No cough, wheezing,  SOB CARDIAC: No chest pain, palpitations, lower extremity edema  GI: No abdominal pain, No N/V/D or constipation, No heartburn or reflux  GU: No dysuria, frequency or urgency, or incontinence  MUSCULOSKELETAL: No unrelieved bone/joint pain NEUROLOGIC: No headache, dizziness  PSYCHIATRIC: No overt anxiety or sadness  Filed Vitals:   01/15/14 2049  BP: 118/69  Pulse: 87  Temp: 97 F (36.1 C)  Resp: 18    Physical Exam  GENERAL APPEARANCE: Alert, conversant, No acute distress  SKIN: No diaphoresis rash, or wounds HEENT: Unremarkable RESPIRATORY: Breathing is even, unlabored. Lung sounds are clear   CARDIOVASCULAR: Heart irreg no murmurs, rubs or gallops. trace peripheral edema  GASTROINTESTINAL: Abdomen is soft, non-tender, not distended w/ normal bowel sounds.  GENITOURINARY: Bladder non tender, not distended  MUSCULOSKELETAL: No abnormal joints or musculature NEUROLOGIC: Cranial nerves 2-12 grossly intact. Moves all extremities PSYCHIATRIC: Mood and affect appropriate to situation, no behavioral issues  Patient Active Problem List   Diagnosis Date Noted  . Chronic atrial fibrillation 01/23/2014  . Vitamin B12 deficiency 01/23/2014  . B12 deficiency anemia 01/23/2014  . Dysphagia, oropharyngeal 01/23/2014  . CKD (chronic kidney disease) stage 2, GFR 60-89 ml/min 10/10/2013  . Atherosclerotic peripheral vascular disease with ulceration 10/10/2013  . Vascular dementia without behavioral disturbance 10/10/2013  . Memory change   . Gait abnormality   . ADD (attention deficit disorder)   . Influenza due to identified novel influenza A virus with other respiratory manifestations 01/19/2013  . Cellulitis of left lower extremity 11/25/2012  . Type 2 diabetes mellitus with neurological complications 11/25/2012  . Type II or unspecified type diabetes mellitus with neurological manifestations, not stated as uncontrolled(250.60) 08/29/2012  . COPD (chronic obstructive pulmonary  disease) with emphysema 08/29/2012  . Diabetic peripheral neuropathy associated with type 2 diabetes mellitus 08/29/2012  . Chronic diastolic CHF (congestive heart failure) 08/29/2012    CBC    Component Value Date/Time   WBC 4.9 05/17/2013 1042   WBC 6.4 03/02/2007 2118   RBC 4.38 05/17/2013 1042   RBC 3.55* 03/02/2007 2118   HGB 11.9* 05/17/2013 1042   HCT 37.0* 05/17/2013 1042   PLT 243 05/17/2013 1042   MCV 85 05/17/2013 1042   LYMPHSABS 1.3 05/17/2013 1042   LYMPHSABS 2.0 03/02/2007 2118   MONOABS 0.5 03/02/2007 2118   EOSABS 0.2 05/17/2013 1042   EOSABS 0.1 03/02/2007 2118   BASOSABS 0.0 05/17/2013 1042  BASOSABS 0.1 03/02/2007 2118    CMP     Component Value Date/Time   NA 141 05/17/2013 1042   NA 130* 03/02/2007 2118   K 4.7 05/17/2013 1042   CL 102 05/17/2013 1042   CO2 27 05/17/2013 1042   GLUCOSE 97 05/17/2013 1042   GLUCOSE 405* 03/02/2007 2118   BUN 29* 05/17/2013 1042   BUN 20 03/02/2007 2118   CREATININE 1.00 05/17/2013 1042   CALCIUM 9.4 05/17/2013 1042   PROT 6.3 05/17/2013 1042   AST 24 05/17/2013 1042   ALT 18 05/17/2013 1042   ALKPHOS 106 05/17/2013 1042   BILITOT 1.0 05/17/2013 1042   GFRNONAA 71 05/17/2013 1042   GFRAA 82 05/17/2013 1042    Assessment and Plan  Cellulitis of left lower extremity In late 11/2013, felt to be 2/2 inc edema from heart failure, seen by cards as outpt and tx with change from lasix to demadex and antibioic per SNF with resolution   Chronic diastolic CHF (congestive heart failure) Exacerbation in late 11/2013, now resolved with change of diuretic to demadex 20 mg BID; Pt also on ACE, ASA   Chronic atrial fibrillation Chronic and stable with rate conrtol not requiring meds, and prophylaxis with ASA only 2/2 fall risk   CKD (chronic kidney disease) stage 2, GFR 60-89 ml/min BUB/Cr  47/1.2 in 11/2013 with CrCl - 55; continue Lisinopril 5 mg    Atherosclerotic peripheral vascular disease with ulceration L  great toe blister in 11/2013, now resolved;currently without wounds;will continue to monitor   COPD (chronic obstructive pulmonary disease) with emphysema No recent exacerbations or infections with spiriva daily and prn symbicort and proair   Vascular dementia without behavioral disturbance Chronic and progressive without major declines;visited with pt for a long while and discussed art and  family hx and I viewed 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 ;MMSE 26/30; continue pt on aricept and namenda   Vitamin B12 deficiency B12- 264 in 06/2013;receiving monthy injections 1000 mcg and plan continuing and recheck level this month   B12 deficiency anemia Hb 11.8 in 11/2013 stable with monthly injections;plan to continue and recheck CBC this month    Dysphagia, oropharyngeal Tolerate regular diet with thin liquids without episodes of aspiration or infection   Diabetic peripheral neuropathy associated with type 2 diabetes mellitus Controlled without pain med;neurontin stopped ;will continue to monitor    Pt seen 01/14/2014     > 35 minutes spent with pt and chart Margit Hanks, MD

## 2014-01-23 ENCOUNTER — Encounter: Payer: Self-pay | Admitting: Internal Medicine

## 2014-01-23 DIAGNOSIS — D519 Vitamin B12 deficiency anemia, unspecified: Secondary | ICD-10-CM | POA: Insufficient documentation

## 2014-01-23 DIAGNOSIS — E538 Deficiency of other specified B group vitamins: Secondary | ICD-10-CM | POA: Insufficient documentation

## 2014-01-23 DIAGNOSIS — R1312 Dysphagia, oropharyngeal phase: Secondary | ICD-10-CM | POA: Insufficient documentation

## 2014-01-23 DIAGNOSIS — I482 Chronic atrial fibrillation, unspecified: Secondary | ICD-10-CM | POA: Insufficient documentation

## 2014-01-23 NOTE — Assessment & Plan Note (Signed)
Controlled without pain med;neurontin stopped ;will continue to monitor

## 2014-01-23 NOTE — Assessment & Plan Note (Signed)
No recent exacerbations or infections with spiriva daily and prn symbicort and proair

## 2014-01-23 NOTE — Assessment & Plan Note (Signed)
Chronic and stable with rate conrtol not requiring meds, and prophylaxis with ASA only 2/2 fall risk

## 2014-01-23 NOTE — Assessment & Plan Note (Signed)
BUB/Cr  47/1.2 in 11/2013 with CrCl - 55; continue Lisinopril 5 mg

## 2014-01-23 NOTE — Assessment & Plan Note (Signed)
L great toe blister in 11/2013, now resolved;currently without wounds;will continue to monitor

## 2014-01-23 NOTE — Assessment & Plan Note (Signed)
Tolerate regular diet with thin liquids without episodes of aspiration or infection

## 2014-01-23 NOTE — Assessment & Plan Note (Signed)
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

## 2014-01-23 NOTE — Assessment & Plan Note (Signed)
Hb 11.8 in 11/2013 stable with monthly injections;plan to continue and recheck CBC this month

## 2014-01-23 NOTE — Assessment & Plan Note (Signed)
B12- 264 in 06/2013;receiving monthy injections 1000 mcg and plan continuing and recheck level this month

## 2014-01-23 NOTE — Assessment & Plan Note (Signed)
Exacerbation in late 11/2013, now resolved with change of diuretic to demadex 20 mg BID; Pt also on ACE, ASA

## 2014-01-23 NOTE — Assessment & Plan Note (Signed)
In late 11/2013, felt to be 2/2 inc edema from heart failure, seen by cards as outpt and tx with change from lasix to demadex and antibioic per SNF with resolution

## 2014-02-04 LAB — BASIC METABOLIC PANEL
BUN: 50 mg/dL — AB (ref 4–21)
CREATININE: 1.1 mg/dL (ref <=1.3)
Glucose: 211 mg/dL
POTASSIUM: 4.1 mmol/L (ref 3.4–5.3)
Sodium: 137 mmol/L (ref 137–147)

## 2014-02-25 ENCOUNTER — Non-Acute Institutional Stay (SKILLED_NURSING_FACILITY): Payer: Medicare Other | Admitting: Internal Medicine

## 2014-02-25 DIAGNOSIS — E038 Other specified hypothyroidism: Secondary | ICD-10-CM

## 2014-02-25 DIAGNOSIS — J439 Emphysema, unspecified: Secondary | ICD-10-CM

## 2014-02-25 DIAGNOSIS — E034 Atrophy of thyroid (acquired): Secondary | ICD-10-CM

## 2014-02-25 NOTE — Progress Notes (Signed)
MRN: [id number] Name: David Ayala  Sex: male Age: 81 y.o. DOB: [birth year]  [facility] #:[facility]  Facility/Room: 425D Level Of Care: SNF Provider: Merrilee SeashoreALEXANDER, ANNE D Emergency Contacts: Extended Emergency Contact Information Primary Emergency Contact: Biddy,Richard  [place] Mobile Phone: [phone] Relation: None  Code Status: DNR  Allergies: Penicillins  Chief Complaint  Patient presents with  . Medical Management of Chronic Issues    HPI: Patient is 81 y.o. male who is being seen for routine issues.  Past Medical History  Diagnosis Date  . Gait abnormality   . Memory change   . COPD (chronic obstructive pulmonary disease)   . Diabetes   . ADD (attention deficit disorder)     History reviewed. No pertinent past surgical history.    Medication List       This list is accurate as of: 02/25/14 11:59 PM.  Always use your most recent med list.               acetaminophen 325 MG tablet  Commonly known as:  TYLENOL  Take 650 mg by mouth every 6 (six) hours as needed.     albuterol (2.5 MG/3ML) 0.083% nebulizer solution  Commonly known as:  PROVENTIL  Take 2.5 mg by nebulization every 6 (six) hours as needed for wheezing or shortness of breath.     aspirin 81 MG tablet  Take 81 mg by mouth daily.     BIOFREEZE 4 % Gel  Generic drug:  Menthol (Topical Analgesic)  Apply topically as needed.     bisacodyl 10 MG suppository  Commonly known as:  DULCOLAX  Place 10 mg rectally as needed for moderate constipation.     DULoxetine 60 MG capsule  Commonly known as:  CYMBALTA  Take 60 mg by mouth daily.     famotidine 40 MG tablet  Commonly known as:  PEPCID  Take 40 mg by mouth daily.     ferrous sulfate 325 (65 FE) MG tablet  Take 325 mg by mouth daily with breakfast.     insulin aspart 100 UNIT/ML injection  Commonly known as:  novoLOG  Inject into the skin 3 (three) times daily before meals. Hold if CBG less than 150.     insulin glargine 100 UNIT/ML injection  Commonly known as:  LANTUS  Inject into the skin at bedtime.     lidocaine 5 %  Commonly known as:  LIDODERM  Place 1 patch onto the skin daily. Remove & Discard patch within 12 hours or as directed by MD     lisinopril 5 MG tablet  Commonly known as:  PRINIVIL,ZESTRIL  Take 5 mg by mouth daily.     Memantine HCl-Donepezil HCl 14-10 MG Cp24  Commonly known as:  NAMZARIC  Take 1 tablet by mouth daily.     nitroGLYCERIN 0.4 MG/SPRAY spray  Commonly known as:  NITROLINGUAL  Place 1 spray under the tongue every 5 (five) minutes x 3 doses as needed for chest pain.     senna 176 MG/5ML Syrp  Commonly known as:  SENOKOT  Take by mouth.     tiotropium 18 MCG inhalation capsule  Commonly known as:  SPIRIVA  Place 18 mcg into inhaler and inhale daily.     torsemide 20 MG tablet  Commonly known as:  DEMADEX  Take 20 mg by mouth 2 (two) times daily.     VICODIN 5-300 MG Tabs  Generic drug:  Hydrocodone-Acetaminophen  Take by mouth as needed.  vitamin B-12 1000 MCG tablet  Commonly known as:  CYANOCOBALAMIN  Take 1,000 mcg by mouth daily.     vitamin C 500 MG tablet  Commonly known as:  ASCORBIC ACID  Take 500 mg by mouth daily.     Vitamin D3 50000 UNITS Caps  Take by mouth daily.        No orders of the defined types were placed in this encounter.    Immunization History  Administered Date(s) Administered  . Influenza Whole 10/20/2012  . Influenza-Unspecified 10/19/2013    History  Substance Use Topics  . Smoking status: Former Games developer  . Smokeless tobacco: Never Used     Comment: Quit 25 years ago.  . Alcohol Use: 0.6 oz/week    1 Shots of liquor per week     Comment: OCC.    Review of Systems  DATA OBTAINED: from patient, nurse GENERAL:  no fevers, fatigue, appetite changes SKIN: No itching, rash HEENT: No complaint RESPIRATORY: No cough, wheezing, SOB; walking endurance decreased CARDIAC: No chest pain,  palpitations, lower extremity edema  GI: No abdominal pain, No N/V/D or constipation, No heartburn or reflux  GU: No dysuria, frequency or urgency, or incontinence  MUSCULOSKELETAL: No unrelieved bone/joint pain NEUROLOGIC: No headache, dizziness  PSYCHIATRIC: No overt anxiety or sadness  Filed Vitals:   02/25/14 2053  BP: 108/59  Pulse: 59  Temp: 97 F (36.1 C)  Resp: 18    Physical Exam  GENERAL APPEARANCE: Alert, conversant, No acute distress  SKIN: No diaphoresis rash HEENT: Unremarkable RESPIRATORY: Breathing is even, unlabored. Lung sounds are diffusely decreased, no wheezing  CARDIOVASCULAR: Heart RRR no murmurs, rubs or gallops. No peripheral edema  GASTROINTESTINAL: Abdomen is soft, non-tender, not distended w/ normal bowel sounds.  GENITOURINARY: Bladder non tender, not distended  MUSCULOSKELETAL: No abnormal joints or musculature NEUROLOGIC: Cranial nerves 2-12 grossly intact. Moves all extremities PSYCHIATRIC: Mood and affect appropriate to situation, no behavioral issues  Patient Active Problem List   Diagnosis Date Noted  . Hypothyroidism 02/27/2014  . Chronic atrial fibrillation 01/23/2014  . Vitamin B12 deficiency 01/23/2014  . B12 deficiency anemia 01/23/2014  . Dysphagia, oropharyngeal 01/23/2014  . CKD (chronic kidney disease) stage 2, GFR 60-89 ml/min 10/10/2013  . Atherosclerotic peripheral vascular disease with ulceration 10/10/2013  . Vascular dementia without behavioral disturbance 10/10/2013  . Memory change   . Gait abnormality   . ADD (attention deficit disorder)   . Influenza due to identified novel influenza A virus with other respiratory manifestations 01/19/2013  . Cellulitis of left lower extremity 11/25/2012  . Type 2 diabetes mellitus with neurological complications 11/25/2012  . Type II or unspecified type diabetes mellitus with neurological manifestations, not stated as uncontrolled(250.60) 08/29/2012  . COPD (chronic obstructive  pulmonary disease) with emphysema 08/29/2012  . Diabetic peripheral neuropathy associated with type 2 diabetes mellitus 08/29/2012  . Chronic diastolic CHF (congestive heart failure) 08/29/2012    CBC    Component Value Date/Time   WBC 4.9 05/17/2013 1042   WBC 6.4 03/02/2007 2118   RBC 4.38 05/17/2013 1042   RBC 3.55* 03/02/2007 2118   HGB 11.9* 05/17/2013 1042   HCT 37.0* 05/17/2013 1042   PLT 243 05/17/2013 1042   MCV 85 05/17/2013 1042   LYMPHSABS 1.3 05/17/2013 1042   LYMPHSABS 2.0 03/02/2007 2118   MONOABS 0.5 03/02/2007 2118   EOSABS 0.2 05/17/2013 1042   EOSABS 0.1 03/02/2007 2118   BASOSABS 0.0 05/17/2013 1042   BASOSABS 0.1 03/02/2007 2118  CMP     Component Value Date/Time   NA 141 05/17/2013 1042   NA 130* 03/02/2007 2118   K 4.7 05/17/2013 1042   CL 102 05/17/2013 1042   CO2 27 05/17/2013 1042   GLUCOSE 97 05/17/2013 1042   GLUCOSE 405* 03/02/2007 2118   BUN 29* 05/17/2013 1042   BUN 20 03/02/2007 2118   CREATININE 1.00 05/17/2013 1042   CALCIUM 9.4 05/17/2013 1042   PROT 6.3 05/17/2013 1042   AST 24 05/17/2013 1042   ALT 18 05/17/2013 1042   ALKPHOS 106 05/17/2013 1042   BILITOT 1.0 05/17/2013 1042   GFRNONAA 71 05/17/2013 1042   GFRAA 82 05/17/2013 1042    Assessment and Plan  Hypothyroidism New problem. TSH had been rising over past 6 months, was11.34 in 01/2014 and synthroin 25 mcg was started.   COPD (chronic obstructive pulmonary disease) with emphysema Pt has had persistent elevations of bicarb and pt has needed prn O2.Also noted less walking intolerance; will inc symbicort to BID.     Margit Hanks, MD

## 2014-02-27 ENCOUNTER — Encounter: Payer: Self-pay | Admitting: Internal Medicine

## 2014-02-27 DIAGNOSIS — E039 Hypothyroidism, unspecified: Secondary | ICD-10-CM | POA: Insufficient documentation

## 2014-02-27 NOTE — Assessment & Plan Note (Signed)
New problem. TSH had been rising over past 6 months, was11.34 in 01/2014 and synthroin 25 mcg was started.

## 2014-02-27 NOTE — Assessment & Plan Note (Signed)
Pt has had persistent elevations of bicarb and pt has needed prn O2.Also noted less walking intolerance; will inc symbicort to BID.

## 2014-03-08 LAB — TSH: TSH: 34.11 u[IU]/mL — AB (ref <=5.90)

## 2014-03-22 ENCOUNTER — Non-Acute Institutional Stay (SKILLED_NURSING_FACILITY): Payer: Medicare Other | Admitting: Internal Medicine

## 2014-03-22 DIAGNOSIS — F330 Major depressive disorder, recurrent, mild: Secondary | ICD-10-CM

## 2014-03-22 DIAGNOSIS — L03116 Cellulitis of left lower limb: Secondary | ICD-10-CM | POA: Diagnosis not present

## 2014-03-22 DIAGNOSIS — F0150 Vascular dementia without behavioral disturbance: Secondary | ICD-10-CM

## 2014-03-22 DIAGNOSIS — R1312 Dysphagia, oropharyngeal phase: Secondary | ICD-10-CM

## 2014-03-22 DIAGNOSIS — G4700 Insomnia, unspecified: Secondary | ICD-10-CM | POA: Diagnosis not present

## 2014-03-22 DIAGNOSIS — K219 Gastro-esophageal reflux disease without esophagitis: Secondary | ICD-10-CM

## 2014-03-22 DIAGNOSIS — N400 Enlarged prostate without lower urinary tract symptoms: Secondary | ICD-10-CM

## 2014-03-22 DIAGNOSIS — M159 Polyosteoarthritis, unspecified: Secondary | ICD-10-CM

## 2014-03-22 DIAGNOSIS — M150 Primary generalized (osteo)arthritis: Secondary | ICD-10-CM

## 2014-03-22 NOTE — Progress Notes (Signed)
MRN: [id number] Name: David Ayala  Sex: male Age: 81 y.o. DOB: February 09, 1931  [facility] #: [name] Facility/Room:425D Level Of Care: SNF Provider: Merrilee Seashore D Emergency Contacts: Extended Emergency Contact Information Primary Emergency Contact: Biddy,Richard  [place] Mobile Phone: [phone] Relation: None  Code Status: DNR  Allergies: Penicillins  Chief Complaint  Patient presents with  . Medical Management of Chronic Issues    HPI: Patient is 81 y.o. male who is being seen for routine issues.  Past Medical History  Diagnosis Date  . Gait abnormality   . Memory change   . COPD (chronic obstructive pulmonary disease)   . Diabetes   . ADD (attention deficit disorder)     History reviewed. No pertinent past surgical history.    Medication List       This list is accurate as of: 03/22/14 11:59 PM.  Always use your most recent med list.               acetaminophen 325 MG tablet  Commonly known as:  TYLENOL  Take 650 mg by mouth every 6 (six) hours as needed.     albuterol (2.5 MG/3ML) 0.083% nebulizer solution  Commonly known as:  PROVENTIL  Take 2.5 mg by nebulization every 6 (six) hours as needed for wheezing or shortness of breath.     aspirin 81 MG tablet  Take 81 mg by mouth daily.     BIOFREEZE 4 % Gel  Generic drug:  Menthol (Topical Analgesic)  Apply topically as needed.     bisacodyl 10 MG suppository  Commonly known as:  DULCOLAX  Place 10 mg rectally as needed for moderate constipation.     DULoxetine 60 MG capsule  Commonly known as:  CYMBALTA  Take 60 mg by mouth daily.     famotidine 40 MG tablet  Commonly known as:  PEPCID  Take 40 mg by mouth daily.     ferrous sulfate 325 (65 FE) MG tablet  Take 325 mg by mouth daily with breakfast.     insulin aspart 100 UNIT/ML injection  Commonly known as:  novoLOG  Inject into the skin 3 (three) times daily before meals. Hold if CBG less than 150.     insulin glargine 100 UNIT/ML injection  Commonly known as:  LANTUS  Inject into the skin at bedtime.     lidocaine 5 %  Commonly known as:  LIDODERM  Place 1 patch onto the skin daily. Remove & Discard patch within 12 hours or as directed by MD     lisinopril 5 MG tablet  Commonly known as:  PRINIVIL,ZESTRIL  Take 5 mg by mouth daily.     Memantine HCl-Donepezil HCl 14-10 MG Cp24  Commonly known as:  NAMZARIC  Take 1 tablet by mouth daily.     nitroGLYCERIN 0.4 MG/SPRAY spray  Commonly known as:  NITROLINGUAL  Place 1 spray under the tongue every 5 (five) minutes x 3 doses as needed for chest pain.     senna 176 MG/5ML Syrp  Commonly known as:  SENOKOT  Take by mouth.     tiotropium 18 MCG inhalation capsule  Commonly known as:  SPIRIVA  Place 18 mcg into inhaler and inhale daily.     torsemide 20 MG tablet  Commonly known as:  DEMADEX  Take 20 mg by mouth 2 (two) times daily.     VICODIN 5-300 MG Tabs  Generic drug:  Hydrocodone-Acetaminophen  Take by mouth as needed.  vitamin B-12 1000 MCG tablet  Commonly known as:  CYANOCOBALAMIN  Take 1,000 mcg by mouth daily.     vitamin C 500 MG tablet  Commonly known as:  ASCORBIC ACID  Take 500 mg by mouth daily.     Vitamin D3 50000 UNITS Caps  Take by mouth daily.        No orders of the defined types were placed in this encounter.    Immunization History  Administered Date(s) Administered  . Influenza Whole 10/20/2012  . Influenza-Unspecified 10/19/2013    History  Substance Use Topics  . Smoking status: Former Games developer  . Smokeless tobacco: Never Used     Comment: Quit 25 years ago.  . Alcohol Use: 0.6 oz/week    1 Shots of liquor per week     Comment: OCC.    Review of Systems  DATA OBTAINED: from patient; only c/o needs something for sleep GENERAL:  no fevers, fatigue, appetite changes SKIN: RLE - cellulitis improved from prior HEENT: No complaint RESPIRATORY: No cough, wheezing, SOB CARDIAC:  No chest pain, palpitations, lower extremity edema  GI: No abdominal pain, No N/V/D or constipation, No heartburn or reflux  GU: No dysuria, frequency or urgency, or incontinence  MUSCULOSKELETAL: No unrelieved bone/joint pain NEUROLOGIC: No headache, dizziness  PSYCHIATRIC: No overt anxiety or sadness  Filed Vitals:   03/22/14 1702  BP: 90/60  Pulse: 66  Temp: 97.1 F (36.2 C)  Resp: 20    Physical Exam  GENERAL APPEARANCE: Alert, conversant, No acute distress  SKIN: cellulitis improved from prior, no heat, dressing in place HEENT: Unremarkable RESPIRATORY: Breathing is even, unlabored. Lung sounds are clear   CARDIOVASCULAR: Heart irreg no murmurs, rubs or gallops. No peripheral edema  GASTROINTESTINAL: Abdomen is soft, non-tender, not distended w/ normal bowel sounds.  GENITOURINARY: Bladder non tender, not distended  MUSCULOSKELETAL: No abnormal joints or musculature NEUROLOGIC: Cranial nerves 2-12 grossly intact. Moves all extremities PSYCHIATRIC: Mood and affect appropriate to situation, mild -mod dementia, no behavioral issues  Patient Active Problem List   Diagnosis Date Noted  . Major depressive disorder, recurrent episode, mild 03/28/2014  . Enlarged prostate without lower urinary tract symptoms (luts) 03/28/2014  . OA (osteoarthritis) 03/28/2014  . GERD (gastroesophageal reflux disease) 03/28/2014  . Hypothyroidism 02/27/2014  . Chronic atrial fibrillation 01/23/2014  . Vitamin B12 deficiency 01/23/2014  . B12 deficiency anemia 01/23/2014  . Dysphagia, oropharyngeal 01/23/2014  . CKD (chronic kidney disease) stage 2, GFR 60-89 ml/min 10/10/2013  . Atherosclerotic peripheral vascular disease with ulceration 10/10/2013  . Vascular dementia without behavioral disturbance 10/10/2013  . Memory change   . Gait abnormality   . ADD (attention deficit disorder)   . Influenza due to identified novel influenza A virus with other respiratory manifestations 01/19/2013  .  Cellulitis of left lower extremity 11/25/2012  . Type 2 diabetes mellitus with neurological complications 11/25/2012  . Type II or unspecified type diabetes mellitus with neurological manifestations, not stated as uncontrolled(250.60) 08/29/2012  . COPD (chronic obstructive pulmonary disease) with emphysema 08/29/2012  . Diabetic peripheral neuropathy associated with type 2 diabetes mellitus 08/29/2012  . Chronic diastolic CHF (congestive heart failure) 08/29/2012    CBC    Component Value Date/Time   WBC 4.9 05/17/2013 1042   WBC 6.4 03/02/2007 2118   RBC 4.38 05/17/2013 1042   RBC 3.55* 03/02/2007 2118   HGB 11.9* 05/17/2013 1042   HCT 37.0* 05/17/2013 1042   PLT 243 05/17/2013 1042   MCV 85 05/17/2013  1042   LYMPHSABS 1.3 05/17/2013 1042   LYMPHSABS 2.0 03/02/2007 2118   MONOABS 0.5 03/02/2007 2118   EOSABS 0.2 05/17/2013 1042   EOSABS 0.1 03/02/2007 2118   BASOSABS 0.0 05/17/2013 1042   BASOSABS 0.1 03/02/2007 2118    CMP     Component Value Date/Time   NA 141 05/17/2013 1042   NA 130* 03/02/2007 2118   K 4.7 05/17/2013 1042   CL 102 05/17/2013 1042   CO2 27 05/17/2013 1042   GLUCOSE 97 05/17/2013 1042   GLUCOSE 405* 03/02/2007 2118   BUN 29* 05/17/2013 1042   BUN 20 03/02/2007 2118   CREATININE 1.00 05/17/2013 1042   CALCIUM 9.4 05/17/2013 1042   PROT 6.3 05/17/2013 1042   AST 24 05/17/2013 1042   ALT 18 05/17/2013 1042   ALKPHOS 106 05/17/2013 1042   BILITOT 1.0 05/17/2013 1042   GFRNONAA 71 05/17/2013 1042   GFRAA 82 05/17/2013 1042    Assessment and Plan  Major depressive disorder, recurrent episode, mild Without signs or sx of depressed mood;Plan- continue cymbalta 60 mg   Cellulitis of left lower extremity Acute and ongoing with increase in foot edema 2/2 rupture of large blister 2/2 poor circulation;mod amount purulent exudate;Plan-start doxycycline 100 mg bID for 7 days and have wound nurse and wound provider continue to follow   Vascular  dementia without behavioral disturbance Reported recent weight loss has stopped;pt with more notable memory loss than prior;Plan- supportive care, continue aricept and namenda   Enlarged prostate without lower urinary tract symptoms (luts) Chronic and stable without meds;no sx UTI;Plan - no meds and continue to monitor   OA (osteoarthritis) Stable and on going with no severe pain or falls;Plan- continue prn tylenol and biofreeze gel to affected joints   GERD (gastroesophageal reflux disease) Pt has had one episode reported of reflux, but that's all;consider well contrlled;Plan-continue Pepcid 40 mg daily   Dysphagia, oropharyngeal Chronic and stable without reports of aspiration;tol reg diet;Plan- continue regular consistency diet     Margit HanksALEXANDER, ANNE D, MD

## 2014-03-28 ENCOUNTER — Encounter: Payer: Self-pay | Admitting: Internal Medicine

## 2014-03-28 DIAGNOSIS — F330 Major depressive disorder, recurrent, mild: Secondary | ICD-10-CM | POA: Insufficient documentation

## 2014-03-28 DIAGNOSIS — G4700 Insomnia, unspecified: Secondary | ICD-10-CM | POA: Insufficient documentation

## 2014-03-28 DIAGNOSIS — K219 Gastro-esophageal reflux disease without esophagitis: Secondary | ICD-10-CM | POA: Insufficient documentation

## 2014-03-28 DIAGNOSIS — N400 Enlarged prostate without lower urinary tract symptoms: Secondary | ICD-10-CM | POA: Insufficient documentation

## 2014-03-28 DIAGNOSIS — M1990 Unspecified osteoarthritis, unspecified site: Secondary | ICD-10-CM | POA: Insufficient documentation

## 2014-03-28 NOTE — Assessment & Plan Note (Signed)
Chronic and stable without meds;no sx UTI;Plan - no meds and continue to monitor

## 2014-03-28 NOTE — Assessment & Plan Note (Signed)
Pt has had one episode reported of reflux, but that's all;consider well contrlled;Plan-continue Pepcid 40 mg daily

## 2014-03-28 NOTE — Assessment & Plan Note (Signed)
Reported recent weight loss has stopped;pt with more notable memory loss than prior;Plan- supportive care, continue aricept and namenda

## 2014-03-28 NOTE — Assessment & Plan Note (Signed)
Without signs or sx of depressed mood;Plan- continue cymbalta 60 mg

## 2014-03-28 NOTE — Assessment & Plan Note (Signed)
Acute and ongoing with increase in foot edema 2/2 rupture of large blister 2/2 poor circulation;mod amount purulent exudate;Plan-start doxycycline 100 mg bID for 7 days and have wound nurse and wound provider continue to follow

## 2014-03-28 NOTE — Assessment & Plan Note (Signed)
Stable and on going with no severe pain or falls;Plan- continue prn tylenol and biofreeze gel to affected joints

## 2014-03-28 NOTE — Assessment & Plan Note (Signed)
Chronic and stable without reports of aspiration;tol reg diet;Plan- continue regular consistency diet

## 2014-03-28 NOTE — Assessment & Plan Note (Signed)
I have noted that pt's room is quite dark, his roommate likes it that way,so melatonin is a reasonable choice for sleep an doesn't interact wit other meds;Plan - start melatonin 5 mg q HS

## 2014-04-22 ENCOUNTER — Ambulatory Visit: Payer: PRIVATE HEALTH INSURANCE | Admitting: Adult Health

## 2014-04-22 ENCOUNTER — Encounter: Payer: Self-pay | Admitting: Neurology

## 2014-04-22 ENCOUNTER — Ambulatory Visit (INDEPENDENT_AMBULATORY_CARE_PROVIDER_SITE_OTHER): Payer: Medicare Other | Admitting: Neurology

## 2014-04-22 VITALS — BP 89/57 | HR 102 | Temp 98.1°F | Wt 164.0 lb

## 2014-04-22 DIAGNOSIS — R413 Other amnesia: Secondary | ICD-10-CM

## 2014-04-22 DIAGNOSIS — R269 Unspecified abnormalities of gait and mobility: Secondary | ICD-10-CM | POA: Diagnosis not present

## 2014-04-22 NOTE — Progress Notes (Signed)
[facility]    Provider:  Dr Lucia Gaskins Referring Provider: No ref. provider found Primary Care Physician:  Angela Cox, MD  CC:  Memory loss  HPI:  David Ayala is a 81 y.o. male here as a follow up. He was diagnosed at the [place] with ADD and was treated. Feels like memory improved. He doesn't know what the medication is called. He says it was similar to Ritalin. He is not on it anymore. He doesn't want to continue it. Even off the Ritalin,he didn't want to overdue it or get hooked on it, memory has significantly improved. 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 Judt diifficulty with numbers and names. But he can recall any scene in his family even when he was [age] year year olde. He has more problems with thingsthat happened in the last week or two. He trys to remember certain people and names that he meets but more difficulty. He tries to remember them by what it sounds like and what their names sound like. He doesn't sleep well, he works on his art work until 3 am. He has to nap after breakfast, this doesn't bother him. He has a problem with his roommate who plays rap music at noight at 3am so he can't slkeep so he complained to the administrator and that is working. Feels like he solved it, getting along ok and he is trying to understand his roommate. His roommate has been thrown out of other rooms, he is jamaican and cant speak english and has a memory problem but they have tried to engage with common interestss like chess and soccer.    HISTORY OF PRESENT ILLNESS (Megan Millikan): David Ayala is a 81 year old male with a history of memory loss. He returns today for follow-up. He is currently taking Namzaric and tolerating it well. He reports that his memory has gotten better. His POA is here with him today and he reports that his memory initially gotten better but in the last several weeks it may have declined  some. He lives at [facility]. Requires minimal assistance with ADLS. He states that he typically can not remember what he did the day before. He has no problem with remembering things 60 years ago. He does tend to forget names and numbers. He often likes to draw things. 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   HISTORY 07/19/13 (YY): David Ayala is referred by his primary care Dr. Ophelia Charter with POA friend of more than 40 years, David Ayala.at today's visit for memory trouble, confusion, decreased mobility. He currently lives at [facility], has been there for one year, assistant living for 5 years. Per referring material, he had past medical history of hypertension, coronary artery disease, congestive heart failure, peripheral vascular disease, diabetes since 2007, prostate hypertrophy, depression, shingles,. 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,  We have reviewed CAT scan in April 2015, showed significant atrophy, periventricular small vessel disease, this was taken because of his increased confusion, he also reported a strokelike event, but could not elaborate on details,  UPDATE July 19 2013:  He continue to complain gait difficulty, mild memory trouble, laboratory evaluation showed normal B12, TSH, elevated A1c 7.4, mild anemia 11.9, I have given him a copy of laboratory  evaluation, you will continue followup with his primary care physician to address diabetes, and anemia,  We also reviewed MRI of the brain, which showed moderate atrophy, mild small vessel disease  Review of Systems: Patient complains of symptoms per HPI as well as the following symptoms: No CP, no SOB. Pertinent negatives per HPI. All others negative.   History   Social History  . Marital Status: Divorced    Spouse Name: N/A  . Number of Children: 4  . Years of Education: college   Occupational History  .      Retired   Social History Main Topics  . Smoking status: Former Games developermoker  . Smokeless tobacco: Never Used     Comment: Quit 25 years ago.  . Alcohol Use: 0.6 oz/week    1 Shots of liquor per week     Comment: OCC.  . Drug Use: No  . Sexual Activity: Not on file   Other Topics Concern  . Not on file   Social History Narrative   Patient lives in [facility] assisted [facility].   Retired.   Education college   Right handed.   Caffeine None       Patient was with his POA David Ayala [phone].    Family History  Problem Relation Age of Onset  . Heart Problems Mother     Past Medical History  Diagnosis Date  . Gait abnormality   . Memory change   . COPD (chronic obstructive pulmonary disease)   . Diabetes   . ADD (attention deficit disorder)     No past surgical history on file.  Current Outpatient Prescriptions  Medication Sig Dispense Refill  . acetaminophen (TYLENOL) 325 MG tablet Take 650 mg by mouth every 6 (six) hours as needed.    Marland Kitchen. albuterol (PROVENTIL) (2.5 MG/3ML) 0.083% nebulizer solution Take 2.5 mg by nebulization every 6 (six) hours as needed for wheezing or shortness of breath.    Marland Kitchen. aspirin 81 MG tablet Take 81 mg by mouth daily.    . bisacodyl (DULCOLAX) 10 MG suppository Place 10 mg rectally as needed for moderate constipation.    . Cholecalciferol (VITAMIN D3) 50000 UNITS CAPS Take by mouth daily.    . DULoxetine (CYMBALTA) 60 MG  capsule Take 60 mg by mouth daily.    . famotidine (PEPCID) 40 MG tablet Take 40 mg by mouth daily.    . ferrous sulfate 325 (65 FE) MG tablet Take 325 mg by mouth daily with breakfast.    . Hydrocodone-Acetaminophen (VICODIN) 5-300 MG TABS Take by mouth as needed.    . insulin aspart (NOVOLOG) 100 UNIT/ML injection Inject into the skin 3 (three) times daily before meals. Hold if CBG less than 150.    Marland Kitchen. insulin glargine (LANTUS) 100 UNIT/ML injection Inject into the skin at bedtime.    . lidocaine (LIDODERM) 5 % Place 1 patch onto the skin daily. Remove & Discard patch within 12 hours or as directed by MD    . lisinopril (PRINIVIL,ZESTRIL) 5  MG tablet Take 5 mg by mouth daily.    . Memantine HCl-Donepezil HCl (NAMZARIC) 14-10 MG CP24 Take 1 tablet by mouth daily. 30 capsule 12  . Menthol, Topical Analgesic, (BIOFREEZE) 4 % GEL Apply topically as needed.    . nitroGLYCERIN (NITROLINGUAL) 0.4 MG/SPRAY spray Place 1 spray under the tongue every 5 (five) minutes x 3 doses as needed for chest pain.    Marland Kitchen senna (SENOKOT) 176 MG/5ML SYRP Take by mouth.    . tiotropium (SPIRIVA) 18 MCG inhalation capsule Place 18 mcg into inhaler and inhale daily.    Marland Kitchen torsemide (DEMADEX) 20 MG tablet Take 20 mg by mouth 2 (two) times daily.    . vitamin B-12 (CYANOCOBALAMIN) 1000 MCG tablet Take 1,000 mcg by mouth daily.    . vitamin C (ASCORBIC ACID) 500 MG tablet Take 500 mg by mouth daily.     No current facility-administered medications for this visit.    Allergies as of 04/22/2014 - Review Complete 04/22/2014  Allergen Reaction Noted  . Penicillins  05/17/2013    Vitals: BP 89/57 mmHg  Pulse 102  Temp(Src) 98.1 F (36.7 C)  Wt 164 lb (74.39 kg) Last Weight:  Wt Readings from Last 1 Encounters:  04/22/14 164 lb (74.39 kg)   Last Height:   Ht Readings from Last 1 Encounters:  10/21/13 6' (1.829 m)    Neurological examination  Mentation: Alert oriented to time, place, history taking. Follows all  commands speech and language fluent. MMSE 25/30 Cranial nerve II-XII: Pupils were equal round reactive to light. Extraocular movements were full, visual field were full on confrontational test. Facial sensation and strength were normal.Uvula tongue midline. Head turning and shoulder shrug were normal and symmetric. Gait and station: Uses a wheelchair today. Able to stand by pushing off chair. Shuffling gait, uses a cane. Tandem gait not attempted. Romberg is negative. No drift is seen.     ASSESSMENT AND PLAN 81 y.o. year old male  has a past medical history of Gait abnormality; Memory change; COPD (chronic obstructive pulmonary disease); Diabetes; and ADD (attention deficit disorder). here with:  1. Memory loss, continue Namzaric 2. Abnormality of gait. 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 At next appointment may consider eval for PD given his shuffling gait. However if patient is happy and unaffected, would hold treatment in any case. Patient rescheduled due to [facility] having a child recently, this was last minute appointment.    Naomie Dean, MD  [facility] [address] [place], [place] [address]  Phone [phone] Fax [phone]  A total of 15 minutes was spent face-to-face with this patient. Over half this time was spent on counseling patient on the memory loss diagnosis and different diagnostic and therapeutic options available.

## 2014-04-22 NOTE — Patient Instructions (Signed)
Overall you are doing fairly well but I do want to suggest a few things today:   Remember to drink plenty of fluid, eat healthy meals and do not skip any meals. Try to eat protein with a every meal and eat a healthy snack such as fruit or nuts in between meals. Try to keep a regular sleep-wake schedule and try to exercise daily, particularly in the form of walking, 20-30 minutes a day, if you can.   As far as your medications are concerned, I would like to suggest: continue current medications  I would like to see you back in 6 months, sooner if we need to. Please call us with any interim questions, concerns, problems, updates or refill requests.   Please also call us for any test results so we can go over those with you on the phone.  My clinical assistant and will answer any of your questions and relay your messages to me and also relay most of my messages to you.   Our phone number is [phone]. We also have an after hours call service for urgent matters and there is a physician on-call for urgent questions. For any emergencies you know to call 911 or go to the nearest emergency room   

## 2014-05-06 ENCOUNTER — Ambulatory Visit: Payer: Self-pay | Admitting: Neurology

## 2014-05-25 ENCOUNTER — Non-Acute Institutional Stay (SKILLED_NURSING_FACILITY): Payer: Medicare Other | Admitting: Internal Medicine

## 2014-05-25 ENCOUNTER — Encounter: Payer: Self-pay | Admitting: Internal Medicine

## 2014-05-25 DIAGNOSIS — I5032 Chronic diastolic (congestive) heart failure: Secondary | ICD-10-CM | POA: Diagnosis not present

## 2014-05-25 NOTE — Progress Notes (Signed)
MRN: [id number] Name: GEORGIA BARIA  Sex: male Age: 81 y.o. DOB: 1931-12-22  [name] Facility/Room: Level Of Care: SNF Provider: Merrilee Seashore D Emergency Contacts: Extended Emergency Contact Information Primary Emergency Contact: Biddy,Richard  [place] Mobile Phone: [phone] Relation: None  Code Status: DNR  Allergies: Penicillins  Chief Complaint  Patient presents with  . Medical Management of Chronic Issues    HPI: Patient is 81 y.o. male who is being seen for routine issues. He has been stable since last admission. I see him daily in the hall and when I have time we discuss politics. He is also very excited about his art.  Past Medical History  Diagnosis Date  . Gait abnormality   . Memory change   . COPD (chronic obstructive pulmonary disease)   . Diabetes   . ADD (attention deficit disorder)     History reviewed. No pertinent past surgical history.    Medication List       This list is accurate as of: 05/25/14 11:59 PM.  Always use your most recent med list.               acetaminophen 325 MG tablet  Commonly known as:  TYLENOL  Take 650 mg by mouth every 6 (six) hours as needed for mild pain.     albuterol (2.5 MG/3ML) 0.083% nebulizer solution  Commonly known as:  PROVENTIL  Take 2.5 mg by nebulization every 6 (six) hours as needed for wheezing or shortness of breath.     aspirin 81 MG tablet  Take 81 mg by mouth daily. For heart disease     BIOFREEZE 4 % Gel  Generic drug:  Menthol (Topical Analgesic)  Apply topically as needed.     bisacodyl 10 MG suppository  Commonly known as:  DULCOLAX  Place 10 mg rectally as needed for moderate constipation.     budesonide-formoterol 80-4.5 MCG/ACT inhaler  Commonly known as:  SYMBICORT  Inhale 2 puffs into the lungs 2 (two) times daily. For COPD     divalproex 250 MG DR tablet  Commonly known as:  DEPAKOTE  Take 250 mg by mouth at bedtime. For mood  disorder     donepezil 10 MG tablet  Commonly known as:  ARICEPT  Take 10 mg by mouth at bedtime.     DULoxetine 60 MG capsule  Commonly known as:  CYMBALTA  Take 60 mg by mouth daily. For depression     famotidine 40 MG tablet  Commonly known as:  PEPCID  Take 40 mg by mouth daily. For reflux     ferrous sulfate 325 (65 FE) MG tablet  Take 325 mg by mouth daily with breakfast. For iron deficiency     insulin aspart 100 UNIT/ML injection  Commonly known as:  novoLOG  Inject into the skin 3 (three) times daily before meals. Hold if CBG less than 150.     insulin detemir 100 UNIT/ML injection  Commonly known as:  LEVEMIR  Inject 18 Units into the skin every morning.     levothyroxine 50 MCG tablet  Commonly known as:  SYNTHROID, LEVOTHROID  Take 50 mcg by mouth daily before breakfast. For hypothyroidism     lidocaine 5 %  Commonly known as:  LIDODERM  Place 1 patch onto the skin daily. Remove & Discard patch within 12 hours or as directed by MD     lisinopril 5 MG tablet  Commonly known as:  PRINIVIL,ZESTRIL  Take  5 mg by mouth daily. For HTN     memantine 14 MG Cp24 24 hr capsule  Commonly known as:  NAMENDA XR  Take 14 mg by mouth daily. For dementia     MILK OF MAGNESIA PO  Take 30 mLs by mouth as needed.     nitroGLYCERIN 0.4 MG/SPRAY spray  Commonly known as:  NITROLINGUAL  Place 1 spray under the tongue every 5 (five) minutes x 3 doses as needed for chest pain. For chronic heart failure     REFRESH OP  Apply 1 drop to eye 2 (two) times daily.     senna 176 MG/5ML Syrp  Commonly known as:  SENOKOT  Take by mouth.     shark liver oil-cocoa butter 0.25-3-85.5 % suppository  Commonly known as:  PREPARATION H  Place 1 suppository rectally as needed for hemorrhoids.     tiotropium 18 MCG inhalation capsule  Commonly known as:  SPIRIVA  Place 18 mcg into inhaler and inhale daily. For COPD     torsemide 20 MG tablet  Commonly known as:  DEMADEX  Take 20 mg  by mouth 2 (two) times daily.     VITAMIN B COMPLEX IJ  Inject 1,000 Units as directed every 30 (thirty) days.     vitamin C 500 MG tablet  Commonly known as:  ASCORBIC ACID  Take 500 mg by mouth daily.     Vitamin D3 50000 UNITS Caps  Take 1 capsule by mouth daily. For Vit D dificiency        Meds ordered this encounter  Medications  . B Complex Vitamins (VITAMIN B COMPLEX IJ)    Sig: Inject 1,000 Units as directed every 30 (thirty) days.  Marland Kitchen. donepezil (ARICEPT) 10 MG tablet    Sig: Take 10 mg by mouth at bedtime.    Immunization History  Administered Date(s) Administered  . Influenza Whole 10/20/2012  . Influenza-Unspecified 10/19/2013    History  Substance Use Topics  . Smoking status: Former Games developermoker  . Smokeless tobacco: Never Used     Comment: Quit 25 years ago.  . Alcohol Use: 0.6 oz/week    1 Shots of liquor per week     Comment: OCC.    Review of Systems  DATA OBTAINED: from patient, nurse GENERAL:  no fevers, fatigue, appetite changes SKIN: No itching, rash HEENT: No complaint RESPIRATORY: No cough, wheezing, SOB CARDIAC: No chest pain, palpitations, lower extremity edema  GI: No abdominal pain, No N/V/D or constipation, No heartburn or reflux  GU: No dysuria, frequency or urgency, or incontinence  MUSCULOSKELETAL: No unrelieved bone/joint pain NEUROLOGIC: No headache, dizziness  PSYCHIATRIC: No overt anxiety or sadness  Filed Vitals:   05/25/14 2015  BP: 106/60  Pulse: 63  Temp: 97.1 F (36.2 C)  Resp: 18    Physical Exam  GENERAL APPEARANCE: Alert, conversant,WM No acute distress  SKIN: No diaphoresis rash, or wounds HEENT: Unremarkable RESPIRATORY: Breathing is even, unlabored. Lung sounds are clear   CARDIOVASCULAR: Heart RRR no murmurs, rubs or gallops. No peripheral edema  GASTROINTESTINAL: Abdomen is soft, non-tender, not distended w/ normal bowel sounds.  GENITOURINARY: Bladder non tender, not distended  MUSCULOSKELETAL: No  abnormal joints or musculature NEUROLOGIC: Cranial nerves 2-12 grossly intact PSYCHIATRIC: Mood and affect appropriate to situation, no behavioral issues  Patient Active Problem List   Diagnosis Date Noted  . Major depressive disorder, recurrent episode, mild 03/28/2014  . Enlarged prostate without lower urinary tract symptoms (luts) 03/28/2014  .  OA (osteoarthritis) 03/28/2014  . GERD (gastroesophageal reflux disease) 03/28/2014  . Insomnia 03/28/2014  . Hypothyroidism 02/27/2014  . Chronic atrial fibrillation 01/23/2014  . Vitamin B12 deficiency 01/23/2014  . B12 deficiency anemia 01/23/2014  . Dysphagia, oropharyngeal 01/23/2014  . CKD (chronic kidney disease) stage 2, GFR 60-89 ml/min 10/10/2013  . Atherosclerotic peripheral vascular disease with ulceration 10/10/2013  . Vascular dementia without behavioral disturbance 10/10/2013  . Memory change   . Gait abnormality   . ADD (attention deficit disorder)   . Influenza due to identified novel influenza A virus with other respiratory manifestations 01/19/2013  . Cellulitis of left lower extremity 11/25/2012  . Type 2 diabetes mellitus with neurological complications 11/25/2012  . Type II or unspecified type diabetes mellitus with neurological manifestations, not stated as uncontrolled 08/29/2012  . COPD (chronic obstructive pulmonary disease) with emphysema 08/29/2012  . Diabetic peripheral neuropathy associated with type 2 diabetes mellitus 08/29/2012  . Chronic diastolic CHF (congestive heart failure) 08/29/2012    CBC    Component Value Date/Time   WBC 4.9 05/17/2013 1042   WBC 6.4 03/02/2007 2118   RBC 4.38 05/17/2013 1042   RBC 3.55* 03/02/2007 2118   HGB 11.9* 05/17/2013 1042   HCT 37.0* 05/17/2013 1042   PLT 243 05/17/2013 1042   MCV 85 05/17/2013 1042   LYMPHSABS 1.3 05/17/2013 1042   LYMPHSABS 2.0 03/02/2007 2118   MONOABS 0.5 03/02/2007 2118   EOSABS 0.2 05/17/2013 1042   EOSABS 0.1 03/02/2007 2118   BASOSABS  0.0 05/17/2013 1042   BASOSABS 0.1 03/02/2007 2118    CMP     Component Value Date/Time   NA 137 02/04/2014   NA 130* 03/02/2007 2118   K 4.1 02/04/2014   CL 102 05/17/2013 1042   CO2 27 05/17/2013 1042   GLUCOSE 97 05/17/2013 1042   GLUCOSE 405* 03/02/2007 2118   BUN 50* 02/04/2014   BUN 20 03/02/2007 2118   CREATININE 1.1 02/04/2014   CREATININE 1.00 05/17/2013 1042   CALCIUM 9.4 05/17/2013 1042   PROT 6.3 05/17/2013 1042   AST 24 05/17/2013 1042   ALT 18 05/17/2013 1042   ALKPHOS 106 05/17/2013 1042   BILITOT 1.0 05/17/2013 1042   GFRNONAA 71 05/17/2013 1042   GFRAA 82 05/17/2013 1042    Assessment and Plan  Chronic diastolic CHF (congestive heart failure) 2 gm Na and 1500   Hypothyroidism T4 and T3 low and TSH >75 on 50 mcg;inc synthroid to 75 and repeat in 6 weks     Margit HanksALEXANDER, ANNE D, MD

## 2014-05-26 ENCOUNTER — Encounter: Payer: Self-pay | Admitting: *Deleted

## 2014-05-27 ENCOUNTER — Encounter: Payer: Self-pay | Admitting: Internal Medicine

## 2014-05-27 NOTE — Assessment & Plan Note (Signed)
T4 and T3 low and TSH >75 on 50 mcg;inc synthroid to 75 and repeat in 6 weks

## 2014-05-27 NOTE — Assessment & Plan Note (Addendum)
2 gm Na and 1500 cc fluid restriction.He has had no exacerbations since last visit.

## 2014-05-29 ENCOUNTER — Encounter: Payer: Self-pay | Admitting: Internal Medicine

## 2014-06-09 IMAGING — CT CT HEAD W/O CM
1 of 3 series · 15 of 30 positions shown, 19 images · non-contrast
Comparison: CT 04/25/2008

CLINICAL DATA: Mental status change

EXAM:
CT HEAD WITHOUT CONTRAST
TECHNIQUE: Contiguous axial images were obtained from the base of the skull
through the vertex without intravenous contrast.

[Series 3: head 2.0 h70h · axial · 0.44mm/px · z∈[-180,-34]mm · 15 of 85 slices shown, 19 images]
[im 6/85  brain]
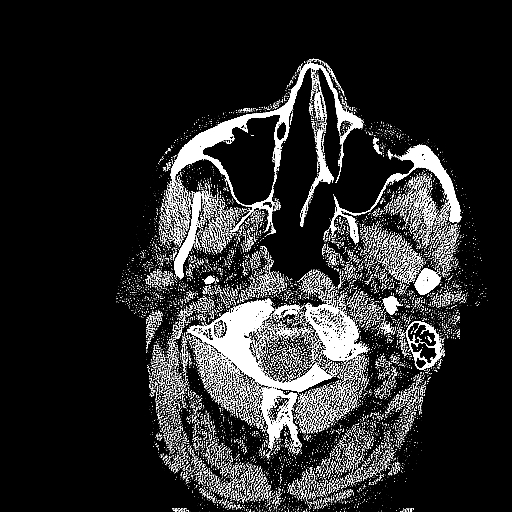
[im 6/85  bone]
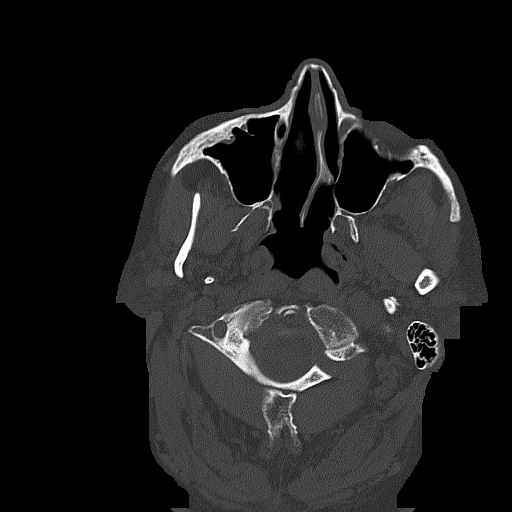
[im 11/85  brain]
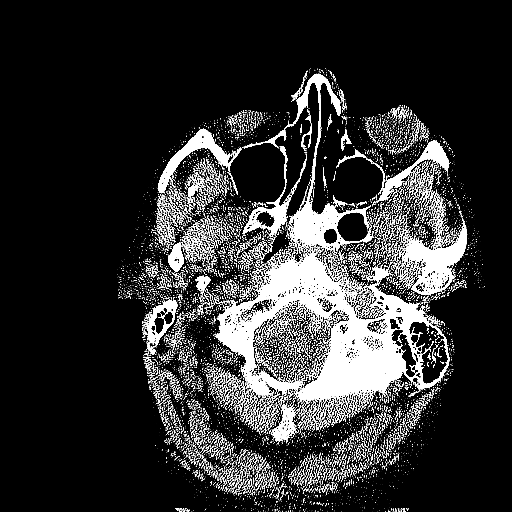
[im 16/85  brain]
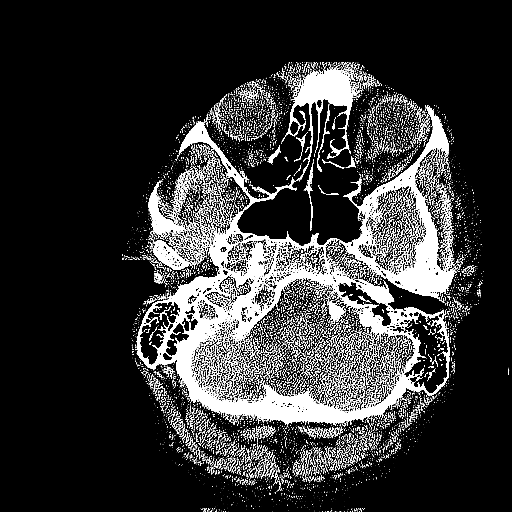
[im 22/85  brain]
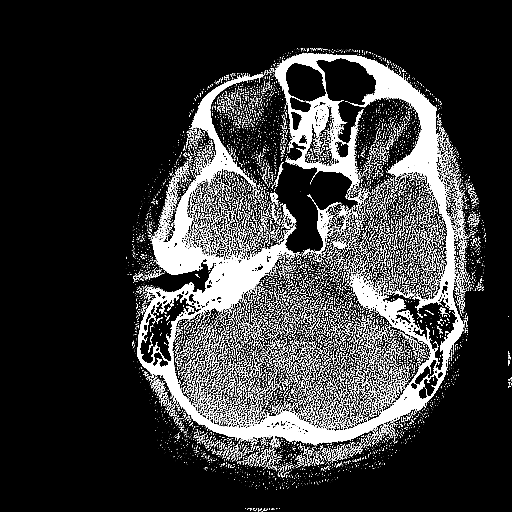
[im 27/85  brain]
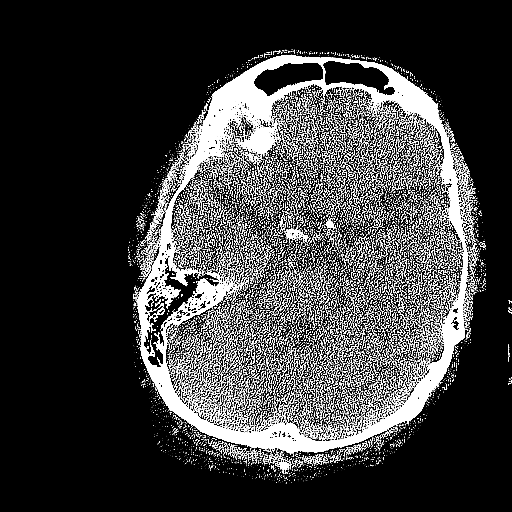
[im 27/85  bone]
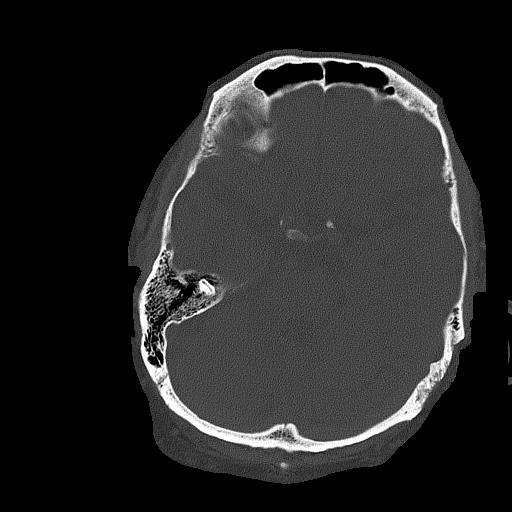
[im 32/85  brain]
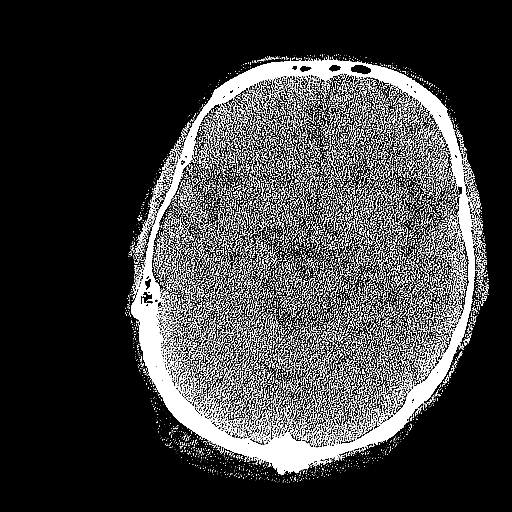
[im 37/85  brain]
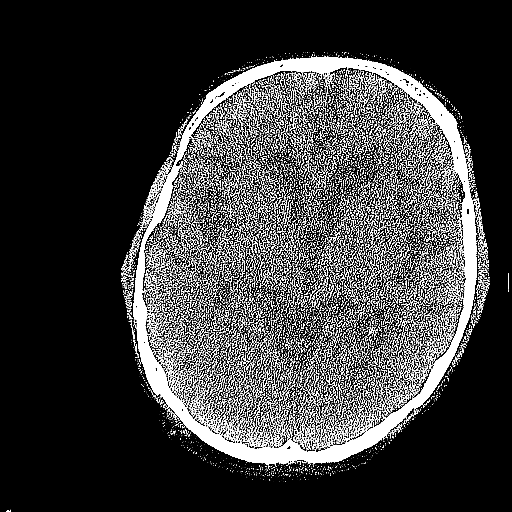
[im 43/85  brain]
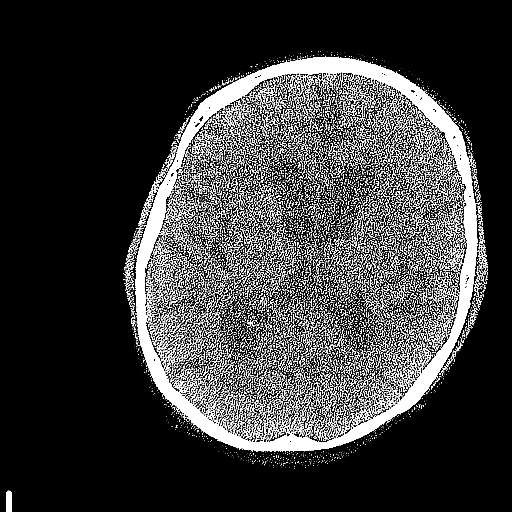
[im 48/85  brain]
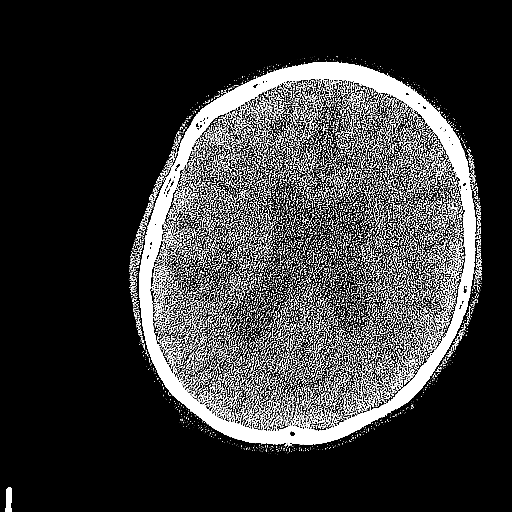
[im 48/85  bone]
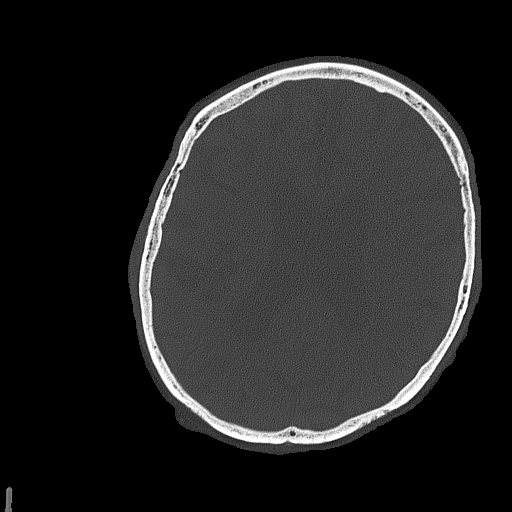
[im 53/85  brain]
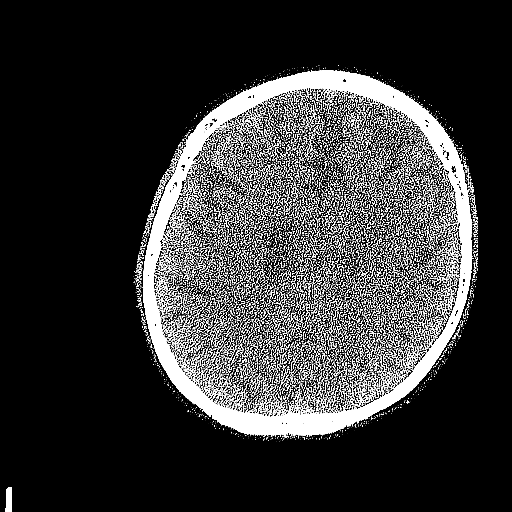
[im 58/85  brain]
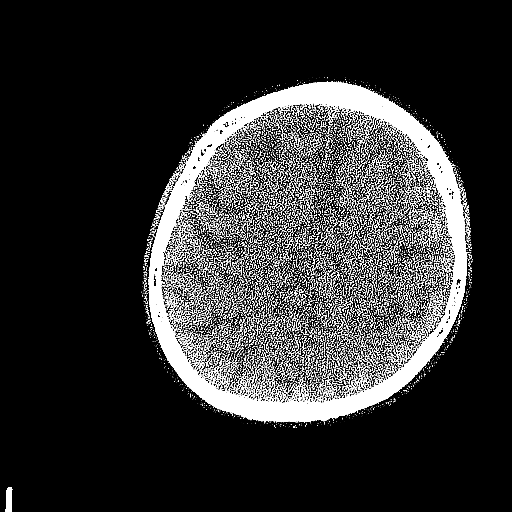
[im 64/85  brain]
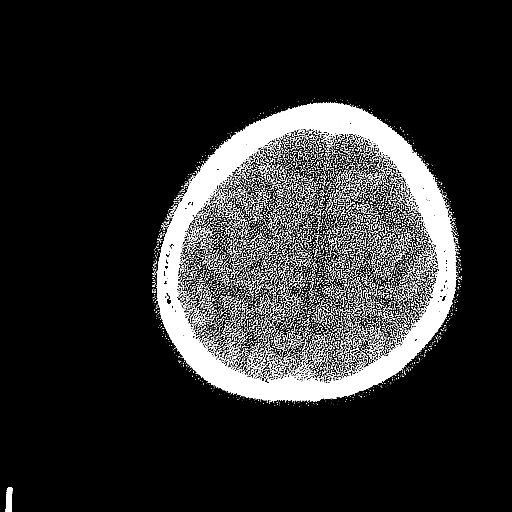
[im 69/85  brain]
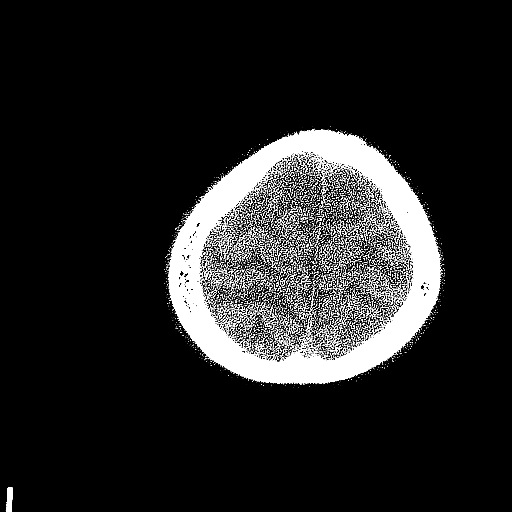
[im 69/85  bone]
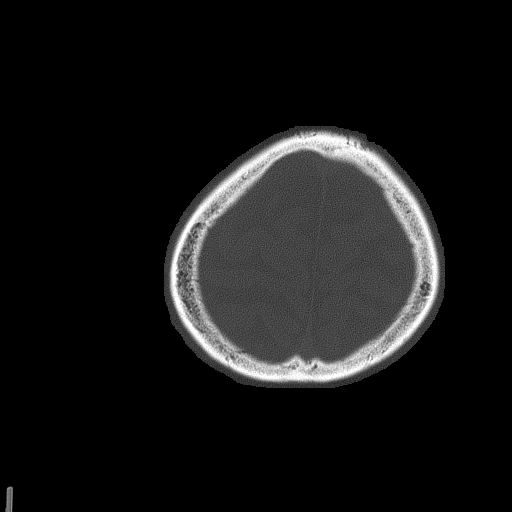
[im 74/85  brain]
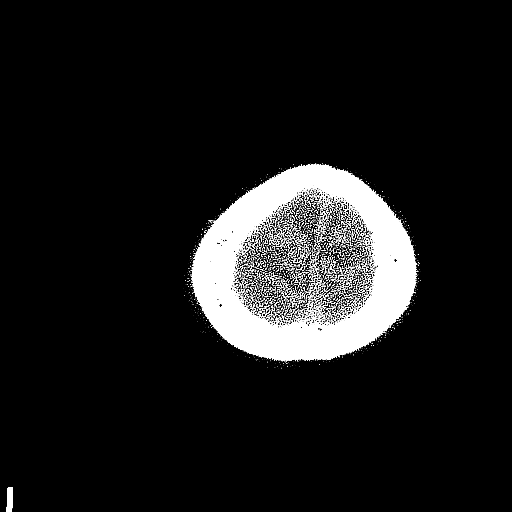
[im 79/85  brain]
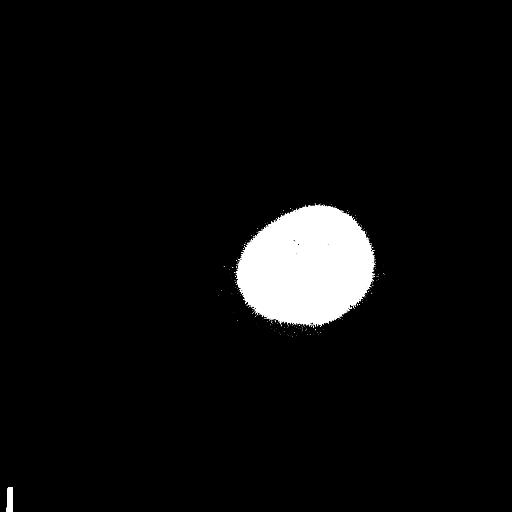

[15 of 30 positions shown; findings below may reference images not displayed]

FINDINGS: Progression of generalized atrophy.

Negative for acute infarct. Negative for hemorrhage or mass. Mild
chronic microvascular ischemic change in the white matter. Calvarium
is intact.
IMPRESSION: Progressive atrophy since 1343.  No acute abnormality.

## 2014-06-29 ENCOUNTER — Encounter: Payer: Self-pay | Admitting: Internal Medicine

## 2014-06-29 ENCOUNTER — Non-Acute Institutional Stay (SKILLED_NURSING_FACILITY): Payer: Medicare Other | Admitting: Internal Medicine

## 2014-06-29 DIAGNOSIS — E038 Other specified hypothyroidism: Secondary | ICD-10-CM | POA: Diagnosis not present

## 2014-06-29 DIAGNOSIS — D519 Vitamin B12 deficiency anemia, unspecified: Secondary | ICD-10-CM | POA: Diagnosis not present

## 2014-06-29 DIAGNOSIS — N182 Chronic kidney disease, stage 2 (mild): Secondary | ICD-10-CM

## 2014-06-29 DIAGNOSIS — E034 Atrophy of thyroid (acquired): Secondary | ICD-10-CM | POA: Diagnosis not present

## 2014-06-29 NOTE — Assessment & Plan Note (Signed)
In 06/2014 BUN 41/Cr 1.6, GFR - 42; all represent decline; will d/c lisinopril to see if that improves fx;if not will restart since he is diabetic

## 2014-06-29 NOTE — Progress Notes (Signed)
MRN: [id number] Name: David Ayala  Sex: male Age: 81 y.o. DOB: [birth year]  [facility] #: [name] Facility/Room: Level Of Care: SNF Provider: Merrilee Seashore D Emergency Contacts: Extended Emergency Contact Information Primary Emergency Contact: Biddy,Richard  [place] Mobile Phone: [phone] Relation: None  Code Status: DNR  Allergies: Penicillins  Chief Complaint  Patient presents with  . Medical Management of Chronic Issues    HPI: Patient is 81 y.o. male who is being seen for routine issues.  Past Medical History  Diagnosis Date  . Gait abnormality   . Memory change   . COPD (chronic obstructive pulmonary disease)   . Diabetes   . ADD (attention deficit disorder)     History reviewed. No pertinent past surgical history.    Medication List       This list is accurate as of: 06/29/14  9:04 PM.  Always use your most recent med list.               acetaminophen 325 MG tablet  Commonly known as:  TYLENOL  Take 650 mg by mouth every 6 (six) hours as needed for mild pain.     albuterol (2.5 MG/3ML) 0.083% nebulizer solution  Commonly known as:  PROVENTIL  Take 2.5 mg by nebulization every 6 (six) hours as needed for wheezing or shortness of breath.     aspirin 81 MG tablet  Take 81 mg by mouth daily. For heart disease     BIOFREEZE 4 % Gel  Generic drug:  Menthol (Topical Analgesic)  Apply topically as needed.     bisacodyl 10 MG suppository  Commonly known as:  DULCOLAX  Place 10 mg rectally as needed for moderate constipation.     budesonide-formoterol 80-4.5 MCG/ACT inhaler  Commonly known as:  SYMBICORT  Inhale 2 puffs into the lungs 2 (two) times daily. For COPD     divalproex 250 MG DR tablet  Commonly known as:  DEPAKOTE  Take 250 mg by mouth at bedtime. For mood disorder     donepezil 10 MG tablet  Commonly known as:  ARICEPT  Take 10 mg by mouth at bedtime.     DULoxetine 60 MG capsule  Commonly known as:   CYMBALTA  Take 60 mg by mouth daily. For depression     famotidine 40 MG tablet  Commonly known as:  PEPCID  Take 40 mg by mouth daily. For reflux     ferrous sulfate 325 (65 FE) MG tablet  Take 325 mg by mouth daily with breakfast. For iron deficiency     insulin aspart 100 UNIT/ML injection  Commonly known as:  novoLOG  Inject into the skin 3 (three) times daily before meals. Hold if CBG less than 150.     insulin detemir 100 UNIT/ML injection  Commonly known as:  LEVEMIR  Inject 18 Units into the skin every morning.     levothyroxine 50 MCG tablet  Commonly known as:  SYNTHROID, LEVOTHROID  Take 50 mcg by mouth daily before breakfast. For hypothyroidism     lidocaine 5 %  Commonly known as:  LIDODERM  Place 1 patch onto the skin daily. Remove & Discard patch within 12 hours or as directed by MD     lisinopril 5 MG tablet  Commonly known as:  PRINIVIL,ZESTRIL  Take 5 mg by mouth daily. For HTN     memantine 14 MG Cp24 24 hr capsule  Commonly known as:  NAMENDA XR  Take 14 mg  by mouth daily. For dementia     MILK OF MAGNESIA PO  Take 30 mLs by mouth as needed.     nitroGLYCERIN 0.4 MG/SPRAY spray  Commonly known as:  NITROLINGUAL  Place 1 spray under the tongue every 5 (five) minutes x 3 doses as needed for chest pain. For chronic heart failure     REFRESH OP  Apply 1 drop to eye 2 (two) times daily.     senna 176 MG/5ML Syrp  Commonly known as:  SENOKOT  Take by mouth.     shark liver oil-cocoa butter 0.25-3-85.5 % suppository  Commonly known as:  PREPARATION H  Place 1 suppository rectally as needed for hemorrhoids.     tiotropium 18 MCG inhalation capsule  Commonly known as:  SPIRIVA  Place 18 mcg into inhaler and inhale daily. For COPD     torsemide 20 MG tablet  Commonly known as:  DEMADEX  Take 20 mg by mouth 2 (two) times daily.     VITAMIN B COMPLEX IJ  Inject 1,000 Units as directed every 30 (thirty) days.     vitamin C 500 MG tablet  Commonly  known as:  ASCORBIC ACID  Take 500 mg by mouth daily.     Vitamin D3 50000 UNITS Caps  Take 1 capsule by mouth daily. For Vit D dificiency        No orders of the defined types were placed in this encounter.    Immunization History  Administered Date(s) Administered  . Influenza Whole 10/20/2012  . Influenza-Unspecified 10/19/2013    History  Substance Use Topics  . Smoking status: Former Games developer  . Smokeless tobacco: Never Used     Comment: Quit 25 years ago.  . Alcohol Use: 0.6 oz/week    1 Shots of liquor per week     Comment: OCC.    Review of Systems  DATA OBTAINED: from patient, nurse; no c/o or concerns GENERAL:  no fevers, fatigue, appetite changes SKIN: No itching, rash HEENT: No complaint RESPIRATORY: No cough, wheezing, SOB CARDIAC: No chest pain, palpitations, lower extremity edema  GI: No abdominal pain, No N/V/D or constipation, No heartburn or reflux  GU: No dysuria, frequency or urgency, or incontinence  MUSCULOSKELETAL: No unrelieved bone/joint pain NEUROLOGIC: No headache, dizziness  PSYCHIATRIC: No overt anxiety or sadness  Filed Vitals:   06/29/14 2036  BP: 90/62  Pulse: 90  Temp: 97.6 F (36.4 C)  Resp: 20    Physical Exam  GENERAL APPEARANCE: Alert, conversant, No acute distress  SKIN: No diaphoresis rash HEENT: Unremarkable RESPIRATORY: Breathing is even, unlabored. Lung sounds are clear   CARDIOVASCULAR: Heart RRR no murmurs, rubs or gallops. No peripheral edema  GASTROINTESTINAL: Abdomen is soft, non-tender, not distended w/ normal bowel sounds.  GENITOURINARY: Bladder non tender, not distended  MUSCULOSKELETAL: No abnormal joints or musculature NEUROLOGIC: Cranial nerves 2-12 grossly intact. Moves all extremities PSYCHIATRIC: pt is very happy with his new room and art space, no behavioral issues  Patient Active Problem List   Diagnosis Date Noted  . Major depressive disorder, recurrent episode, mild 03/28/2014  . Enlarged  prostate without lower urinary tract symptoms (luts) 03/28/2014  . OA (osteoarthritis) 03/28/2014  . GERD (gastroesophageal reflux disease) 03/28/2014  . Insomnia 03/28/2014  . Hypothyroidism 02/27/2014  . Chronic atrial fibrillation 01/23/2014  . Vitamin B12 deficiency 01/23/2014  . B12 deficiency anemia 01/23/2014  . Dysphagia, oropharyngeal 01/23/2014  . CKD (chronic kidney disease) stage 2, GFR 60-89 ml/min 10/10/2013  .  Atherosclerotic peripheral vascular disease with ulceration 10/10/2013  . Vascular dementia without behavioral disturbance 10/10/2013  . Memory change   . Gait abnormality   . ADD (attention deficit disorder)   . Influenza due to identified novel influenza A virus with other respiratory manifestations 01/19/2013  . Cellulitis of left lower extremity 11/25/2012  . Type 2 diabetes mellitus with neurological complications 11/25/2012  . Type II or unspecified type diabetes mellitus with neurological manifestations, not stated as uncontrolled 08/29/2012  . COPD (chronic obstructive pulmonary disease) with emphysema 08/29/2012  . Diabetic peripheral neuropathy associated with type 2 diabetes mellitus 08/29/2012  . Chronic diastolic CHF (congestive heart failure) 08/29/2012    CBC    Component Value Date/Time   WBC 4.9 05/17/2013 1042   WBC 6.4 03/02/2007 2118   RBC 4.38 05/17/2013 1042   RBC 3.55* 03/02/2007 2118   HGB 11.9* 05/17/2013 1042   HCT 37.0* 05/17/2013 1042   PLT 243 05/17/2013 1042   MCV 85 05/17/2013 1042   LYMPHSABS 1.3 05/17/2013 1042   LYMPHSABS 2.0 03/02/2007 2118   MONOABS 0.5 03/02/2007 2118   EOSABS 0.2 05/17/2013 1042   EOSABS 0.1 03/02/2007 2118   BASOSABS 0.0 05/17/2013 1042   BASOSABS 0.1 03/02/2007 2118    CMP     Component Value Date/Time   NA 137 02/04/2014   NA 130* 03/02/2007 2118   K 4.1 02/04/2014   CL 102 05/17/2013 1042   CO2 27 05/17/2013 1042   GLUCOSE 97 05/17/2013 1042   GLUCOSE 405* 03/02/2007 2118   BUN 50*  02/04/2014   BUN 20 03/02/2007 2118   CREATININE 1.1 02/04/2014   CREATININE 1.00 05/17/2013 1042   CALCIUM 9.4 05/17/2013 1042   PROT 6.3 05/17/2013 1042   AST 24 05/17/2013 1042   ALT 18 05/17/2013 1042   ALKPHOS 106 05/17/2013 1042   BILITOT 1.0 05/17/2013 1042   GFRNONAA 71 05/17/2013 1042   GFRAA 82 05/17/2013 1042    Assessment and Plan  Hypothyroidism 06/28/2014 TSH 62.9 on 75 mcg;Plan - increase to 112 mcg and repeat TSH 6 weeks.  B12 deficiency anemia Hb 06/2014 was 11.7, stable from prior; Plan-cont B12  CKD (chronic kidney disease) stage 2, GFR 60-89 ml/min In 06/2014 BUN 41/Cr 1.6, GFR - 42; all represent decline; will d/c lisinopril to see if that improves fx;if not will restart since he is diabetic   Time spent with pt > 35 min Margit Hanks, MD

## 2014-06-29 NOTE — Assessment & Plan Note (Signed)
06/28/2014 TSH 62.9 on 75 mcg;Plan - increase to 112 mcg and repeat TSH 6 weeks.

## 2014-06-29 NOTE — Assessment & Plan Note (Signed)
Hb 06/2014 was 11.7, stable from prior; Plan-cont B12

## 2014-10-24 ENCOUNTER — Ambulatory Visit (INDEPENDENT_AMBULATORY_CARE_PROVIDER_SITE_OTHER): Payer: Medicare Other | Admitting: Neurology

## 2014-10-24 ENCOUNTER — Encounter: Payer: Self-pay | Admitting: Neurology

## 2014-10-24 VITALS — BP 100/68 | HR 103 | Temp 97.7°F

## 2014-10-24 DIAGNOSIS — G20 Parkinson's disease: Secondary | ICD-10-CM | POA: Diagnosis not present

## 2014-10-24 DIAGNOSIS — R413 Other amnesia: Secondary | ICD-10-CM

## 2014-10-24 MED ORDER — CARBIDOPA-LEVODOPA 25-100 MG PO TABS: 0.5000 | ORAL_TABLET | Freq: Three times a day (TID) | ORAL | Status: AC

## 2014-10-24 NOTE — Progress Notes (Signed)
[facility]    Provider: Dr Lucia Gaskins Referring Provider: No ref. provider found Primary Care Physician: Angela Cox, MD  CC: Memory loss and gait disorder.  Interval update 10/24/2014: Patient is a very lovely 81 year old patient who is here for follow up. He has been seeing my colleagues Dr. Terrace Arabia and Everlene Other for memory loss and gait disorder. He has a PMHx of PVD, COPD, b12 deficiency, hypothyroidism, peripheral neuropathy, diabetes, CKD, CAD.  He lives at [facility] assisted living facility. 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  He is also a Education administrator.  He is painting more than ever He is being featured in the [employer] this month. He has a paintingexhibit in [employer]. He has paintings on display. He has 450 images that he prints on cards, all original. 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 He lives in a studio and he lives and paints there. He wants the new MAC computer. He is in physical therapy currently. He has a shuffling gait, getting better with physical therapy and with use of a cane. Denies tremor unless is low on sugar. Denies freezing episodes. Feels stiffness in the right and left leg. No falls however he feels unsafe.   He came into the office alone today. I called and spoke to his POA on the phone. POA states he has had a shuffling gait for a few years maybe longer, worse in the last year. Patient is very unbalanced and afraid to fall. Father had parkinson's disease. POA does endorse a tremor occasionally and increasing unsteadiness, shaking making it difficult to write his name and paint.POA also reports more behavioral symptoms, patient called the police on his caregivers, more temperament changes, no hallucinations or delusions. Scarlette Calico is POA [phone].  04/22/2014: David Ayala is a 81 y.o. male here as a follow up. He was seen in clinic  due to Butch Penny being on maternity leave and he is a patient of Dr. Zannie Cove. He was diagnosed at the [place] with ADD and was treated. Feels like memory improved. He doesn't know what the medication is called. He says it was similar to Ritalin. He is not on it anymore. He doesn't want to continue it. Even off the Ritalin,he didn't want to overdue it or get hooked on it, memory has significantly improved. 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 He has diifficulty with numbers and names. But he can recall any scene in his family even when he was [age] year year olde. He has more problems with thingsthat happened in the last week or two. He trys to remember certain people and names that he meets but more difficulty. He tries to remember them by what it sounds like and what their names sound like. He doesn't sleep well, he works on his art work until 3 am. He has to nap after breakfast, this doesn't bother him. He has a problem with his roommate who plays rap music at noight at 3am so he can't slkeep so he complained to the administrator and that is working. Feels like he solved it, getting along ok and he is trying to understand his roommate. His roommate has been thrown out of other rooms, he is jamaican and cant speak english and has a memory problem but they have tried to engage with common interestss like chess and soccer.  HISTORY OF PRESENT ILLNESS (Megan Millikan): David Ayala is a 81 year old male with a history of memory loss. He returns today for follow-up. He is currently taking Namzaric and tolerating it well. He reports that his memory has gotten better. His POA is here with him today and he reports that his memory initially gotten better but in the last several weeks it may have declined some. He lives at [facility]. Requires minimal assistance with ADLS. He states that he typically can not remember what he did the day before. He has no problem with  remembering things 60 years ago. He does tend to forget names and numbers. He often likes to draw things. 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   HISTORY 07/19/13 Levert Feinstein): David Ayala is referred by his primary care Dr. Ophelia Charter with POA friend of more than 40 years, David Ayala.at today's visit for memory trouble, confusion, decreased mobility. He currently lives at [facility], has been there for one year, assistant living for 5 years. Per referring material, he had past medical history of hypertension, coronary artery disease, congestive heart failure, peripheral vascular disease, diabetes since 2007, prostate hypertrophy, depression, shingles,. 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,  We have reviewed CAT scan in April 2015, showed significant atrophy, periventricular small vessel disease, this was taken because of his increased confusion, he also reported a strokelike event, but could not elaborate on details,  UPDATE July 19 2013:  He continue to complain gait difficulty, mild memory trouble, laboratory evaluation showed normal B12, TSH, elevated A1c 7.4, mild anemia 11.9, I have given him a copy of laboratory evaluation, you will continue followup with his primary care physician to address diabetes, and anemia,  We also reviewed MRI of the brain, which showed moderate  atrophy, mild small vessel disease  HISTORY 05/17/13 Levert Feinstein): David Ayala is referred by his primary care Dr. Ophelia Charter with POA friend of more than 40 years, David Ayala.at today's visit for memory trouble, confusion, decreased mobility. He currently lives at [facility] assistance bathing, has been there for one year, as it in the system living for 5 years.  Per referring material, he had past medical history of hypertension, coronary artery disease, congestive heart failure, peripheral vascular disease, diabetes since 2007, prostate hypertrophy, depression, shingles,. COPD, uses home oxygen,  He retired from Armed forces operational officer at 89, he has lived in assisted living since 2010, because he was living by himself, was not able to take care of himself, his diabetes was out of control, he had fell few times before he was placed to assistant living  He is now having gait difficulty, still design greeting cards, but did describe difficulty focus, lack of concentration, tends to forget names,  He immigrant from [place] at age 73, was able to talk for different kind of language, is writing  books about his life experience,  We have reviewed CAT scan in April 2015, showed significant atrophy, periventricular small vessel disease, this was taken because of his increased confusion, he also reported a strokelike event, but could not elaborate on details,   Review of Systems: Patient complains of symptoms per HPI as well as the following symptoms: memory loss, no CP, no SOB. Pertinent negatives per HPI. All others negative.   Social History   Social History  . Marital Status: Divorced    Spouse Name: N/A  . Number of Children: 4  . Years of Education: college   Occupational History  .      Retired   Social History Main Topics  . Smoking status: Former Games developer  . Smokeless tobacco: Never Used     Comment: Quit 25 years ago.  . Alcohol Use: 0.6 oz/week    1 Shots of liquor per  week     Comment: OCC.  . Drug Use: No  . Sexual Activity: Not on file   Other Topics Concern  . Not on file   Social History Narrative   Patient lives in [facility] assisted [facility].   Retired.   Education college   Right handed.   Caffeine None       Patient was with his POA David Ayala 336 [phone].    Family History  Problem Relation Age of Onset  . Heart Problems Mother   . Parkinsonism Father     Past Medical History  Diagnosis Date  . Gait abnormality   . Memory change   . COPD (chronic obstructive pulmonary disease) (HCC)   . Diabetes (HCC)   . ADD (attention deficit disorder)     History reviewed. No pertinent past surgical history.  Current Outpatient Prescriptions  Medication Sig Dispense Refill  . acetaminophen (TYLENOL) 325 MG tablet Take 650 mg by mouth every 6 (six) hours as needed for mild pain.     Marland Kitchen aspirin 81 MG tablet Take 81 mg by mouth daily. For heart disease    . bisacodyl (DULCOLAX) 10 MG suppository Place 10 mg rectally as needed for moderate constipation.    . budesonide-formoterol (SYMBICORT) 80-4.5 MCG/ACT inhaler Inhale 2 puffs into the lungs 2 (two) times daily. For COPD    . Cholecalciferol (VITAMIN D3) 50000 UNITS CAPS Take 1 capsule by mouth daily. For Vit D dificiency    . Cyanocobalamin (VITAMIN B-12 IJ) Inject 1 Dose as directed every 30 (thirty) days.    . divalproex (DEPAKOTE) 250 MG DR tablet Take 125 mg by mouth at bedtime. Give 4 capsules to equal  by mouth every HS For mood disorder    . donepezil (ARICEPT) 10 MG tablet Take 10 mg by mouth at bedtime.    . DULoxetine (CYMBALTA) 60 MG capsule Take 60 mg by mouth daily. For depression    . famotidine (PEPCID) 40 MG tablet Take 40 mg by mouth daily. For reflux    . ferrous sulfate 325 (65 FE) MG tablet Take 325 mg by mouth daily with breakfast. For iron deficiency    . insulin detemir (LEVEMIR) 100 UNIT/ML injection Inject 15 Units into the skin every morning.     Marland Kitchen  levothyroxine (SYNTHROID, LEVOTHROID) 50 MCG tablet Take 50 mcg by mouth daily before breakfast. For hypothyroidism    . lidocaine (LIDODERM) 5 % Place 1 patch onto the skin daily. Remove & Discard patch within 12 hours or as directed by MD    . Magnesium Hydroxide (  MILK OF MAGNESIA PO) Take 30 mLs by mouth as needed.    . memantine (NAMENDA XR) 14 MG CP24 24 hr capsule Take 14 mg by mouth daily. For dementia    . Menthol, Topical Analgesic, (BIOFREEZE) 4 % GEL Apply topically as needed.    . mirtazapine (REMERON) 15 MG tablet Take 15 mg by mouth at bedtime.    . nitroGLYCERIN (NITROLINGUAL) 0.4 MG/SPRAY spray Place 1 spray under the tongue every 5 (five) minutes x 3 doses as needed for chest pain. For chronic heart failure    . Polyvinyl Alcohol-Povidone (REFRESH OP) Apply 1 drop to eye 2 (two) times daily.    Marland Kitchen. tiotropium (SPIRIVA) 18 MCG inhalation capsule Place 18 mcg into inhaler and inhale daily. For COPD    . torsemide (DEMADEX) 20 MG tablet Take 20 mg by mouth daily.     . vitamin C (ASCORBIC ACID) 500 MG tablet Take 500 mg by mouth daily.    Marland Kitchen. albuterol (PROVENTIL) (2.5 MG/3ML) 0.083% nebulizer solution Take 2.5 mg by nebulization every 6 (six) hours as needed for wheezing or shortness of breath.    . B Complex Vitamins (VITAMIN B COMPLEX IJ) Inject 1,000 Units as directed every 30 (thirty) days.    . insulin aspart (NOVOLOG) 100 UNIT/ML injection Inject into the skin 3 (three) times daily before meals. Hold if CBG less than 150.    Marland Kitchen. lisinopril (PRINIVIL,ZESTRIL) 5 MG tablet Take 5 mg by mouth daily. For HTN    . senna (SENOKOT) 176 MG/5ML SYRP Take by mouth.    . shark liver oil-cocoa butter (PREPARATION H) 0.25-3-85.5 % suppository Place 1 suppository rectally as needed for hemorrhoids.     No current facility-administered medications for this visit.    Allergies as of 10/24/2014 - Review Complete 10/24/2014  Allergen Reaction Noted  . Penicillins  05/17/2013    Vitals: BP  100/68 mmHg  Pulse 103  Temp(Src) 97.7 F (36.5 C) (Oral) Last Weight:  Wt Readings from Last 1 Encounters:  04/22/14 164 lb (74.39 kg)   Last Height:   Ht Readings from Last 1 Encounters:  10/21/13 6' (1.829 m)    Neurological examination  Gen: Conversant, pleasant Mentation: Alert oriented to time, place, history taking. Follows all commands speech and language fluent. Last noted MMSE was 26/30 today it is 24/30 Cranial nerve II-XII: Pupils were equal round reactive to light. Extraocular movements were full, visual field were full on confrontational test. Facial sensation and strength were normal.Uvula tongue midline. Head turning and shoulder shrug were normal and symmetric. Strength: 5/5 throughout Coordination and tone: No dysmetria. Cogwheeling in the upper right extremity.  Gait and station: Uses a wheelchair today. Able to stand by pushing off chair. Shuffling gait, decreased arm swing, stooped posture. en bloc turning. No tremor.   MMSE - Mini Mental State Exam 10/24/2014 07/19/2013  Orientation to time 5 3  Orientation to Place 5 5  Registration 3 3  Attention/ Calculation 1 5  Attention/Calculation-comments - world backwards  Recall 1 1  Language- name 2 objects 2 2  Language- repeat 1 1  Language- follow 3 step command 3 3  Language- read & follow direction 1 1  Write a sentence 1 1  Copy design 1 1  Total score 24 26      ASSESSMENT AND PLAN 81 y.o. year old male has a past medical history of Gait abnormality; Memory change; COPD (chronic obstructive pulmonary disease); Diabetes; and ADD (attention deficit disorder),  PVD, b12 deficiency, hypothyroidism, peripheral neuropathy, diabetes, CKD, CAD.  here alone today. MRi of the brain 06/2013 showed mild changes of chronic microvascular ischemia and moderate degree of generalized cerebral atrophy. His cognition has mildly declined in the last year from 26/30 to 24/30 today. He is parkinsonian on exam and this has  worsened in the last year per his POA.     1. Memory loss, continue Namzaric.  MoCA 24/30 (a year ago 26/30) B12 and tsh 2015 within normal limits. Continue daily B12 supplementation. No hallucinations or delusions. Mild behavioral problems, overall can be managed and appears to be on Depakote for mood.  2. Abnormality of gait, parkinsonism. He is markedly shuffling with decreased arm swing and stooped posture, cogwheeling of upper extremity, en bloc turning.  Will start low-dose sinemet and see if there is any improvement. Discussed with his POA and with patient. Can start at 1/2 tablet Sinemet 25-100 three times a day. Common side effects of Sinemet include dizziness, drowsiness, nausea and GI side effects. Watch for hypotension. Unlikely but serious side effects can include confusion, hallucinations, involuntary movements/spasms. Stop for any concerning symptoms. Try to separate Sinemet from food (especially protein-rich foods like meat, dairy, eggs) by about 30-60 mins as this will help the absorption of the medication. If you have some nausea with the medication, you can take it with some light food like crackers or ginger ale  3. Needs to follow closely with pcp for management of vascular risk factors.Cont ASA for stroke prevention.   Patient can follow up with myself or Dr. Levert Feinstein in 3 months to see if Sinemet shows improvement. Can continue to titrate the Sinemet. Discussed with POA that he should come to next appointment.    [facility] [address] [place], [place] [address]  Phone [phone] Fax [phone]  A total of 45 minutes was spent face-to-face with this patient. Over half this time was spent on counseling patient on the memory loss diagnosis and different diagnostic and therapeutic options available.

## 2014-10-24 NOTE — Patient Instructions (Signed)
Overall you are doing fairly well but I do want to suggest a few things today:   Remember to drink plenty of fluid, eat healthy meals and do not skip any meals. Try to eat protein with a every meal and eat a healthy snack such as fruit or nuts in between meals. Try to keep a regular sleep-wake schedule and try to exercise daily, particularly in the form of walking, 20-30 minutes a day, if you can.   As far as your medications are concerned, I would like to suggest Continue Namzeric Will start low-dose sinemet and see if there is any improvement. Discussed with his POA and with patient. Can start at 1/2 tablet Sinemet 25-100 three times a day. Common side effects of Sinemet include dizziness, drowsiness, nausea and GI side effects. Watch for hypotension. Unlikely but serious side effects can include confusion, hallucinations, involuntary movements/spasms. Stop for any concerning symptoms. Try to separate Sinemet from food (especially protein-rich foods like meat, dairy, eggs) by about 30-60 mins as this will help the absorption of the medication. If you have some nausea with the medication, you can take it with some light food like crackers or ginger ale   I would like to see you back in XXXXX, sooner if we need to. Please call us with any interim questions, concerns, problems, updates or refill requests.   Please also call us for any test results so we can go over those with you on the phone.  My clinical assistant and will answer any of your questions and relay your messages to me and also relay most of my messages to you.   Our phone number is [phone]. We also have an after hours call service for urgent matters and there is a physician on-call for urgent questions. For any emergencies you know to call 911 or go to the nearest emergency room

## 2014-10-25 ENCOUNTER — Telehealth: Payer: Self-pay | Admitting: Neurology

## 2014-10-25 NOTE — Telephone Encounter (Signed)
Kara Meadmma, patient's note needs to be faxed to his facility [phone] or [phone]. This is [facility]. The phone number there is [phone]. I started Sinemet, I was wondering if they needed a prescription or if my note with instructions would suffice? Please give them a call and let me know. Also we should inform his POA Scarlette CalicoRichard Biddy is POA [phone] of patient's next appointment so they can attend, it may be with Dr. Terrace ArabiaYan as this was her patient thanks

## 2014-10-26 NOTE — Telephone Encounter (Signed)
FYI-[facility] called to advise they did receive all the information that was faxed to them on the patient.

## 2014-10-26 NOTE — Telephone Encounter (Signed)
Called [facility]. Operator answered called and transferred me to [place]. She then transferred me to Olu. Advised her that I faxed over recent office note from Dr. Lucia GaskinsAhern at [phone]. Received fax confirmation. Wanted to know since Dr. Lucia GaskinsAhern started pt on Sinemet, if they needed a prescription or if her office note with instructions would be sufficient enough. She is going to look at office note and call back to let me know. Gave [facility] phone number and my name.

## 2014-10-26 NOTE — Telephone Encounter (Signed)
Called pt POA, Scarlette CalicoRichard Biddy to inform him of appt on 01/23/14 with Dr. Terrace ArabiaYan at 12pm. Told him to arrive about 15-120minutes before appt time. He verbalized understanding. I informed him I will let [facility] know about appt. I faxed Dr. Lucia GaskinsAhern recent office note to their facility. Told him to call with any further questions. He verbalized understanding.

## 2014-10-26 NOTE — Telephone Encounter (Signed)
Called Olu back at [facility]. She verified office note was sufficient enough for sinemet that Dr Lucia GaskinsAhern started him on. PA already reviewed order and signed off on it at their facility. Also informed her that pt has f/u appt with Dr. Terrace ArabiaYan on 01/23/14 at 12pm for check-in at 1130am. She verbalized understanding.

## 2014-12-27 ENCOUNTER — Non-Acute Institutional Stay (SKILLED_NURSING_FACILITY): Payer: Medicare Other | Admitting: Internal Medicine

## 2014-12-27 ENCOUNTER — Encounter: Payer: Self-pay | Admitting: Internal Medicine

## 2014-12-27 DIAGNOSIS — E559 Vitamin D deficiency, unspecified: Secondary | ICD-10-CM | POA: Insufficient documentation

## 2014-12-27 DIAGNOSIS — I5032 Chronic diastolic (congestive) heart failure: Secondary | ICD-10-CM | POA: Diagnosis not present

## 2014-12-27 DIAGNOSIS — G20 Parkinson's disease: Secondary | ICD-10-CM | POA: Diagnosis not present

## 2014-12-27 NOTE — Assessment & Plan Note (Signed)
Has not been on medications prior but noted v markedly shuffling with decreased arm swing and stooped posture, cogwheeling of upper extremity, en bloc turning. Start low-dose sinemet and see if there is any improvement,  1/2 tablet Sinemet 25-100 three times a day; will need to monitor BP; will d/c demadex from 10 mg q am and 20 mg qHS to 10 mg BID and see if that helps some

## 2014-12-27 NOTE — Progress Notes (Signed)
MRN: [id number] Name: David Ayala  Sex: male Age: 81 y.o. DOB: 1931-09-20  [facility] #: [name] Facility/Room: Level Of Care: SNF Provider: Merrilee Seashore D Emergency Contacts: Extended Emergency Contact Information Primary Emergency Contact: Biddy,Richard  [place] Mobile Phone: [phone] Relation: None  Code Status:   Allergies: Penicillins  Chief Complaint  Patient presents with  . Medical Management of Chronic Issues    HPI: Patient is 81 y.o. male who is being seen for routine issues of Parkinson's, CHF and Vit D def.  Past Medical History  Diagnosis Date  . Gait abnormality   . Memory change   . COPD (chronic obstructive pulmonary disease) (HCC)   . Diabetes (HCC)   . ADD (attention deficit disorder)     History reviewed. No pertinent past surgical history.    Medication List       This list is accurate as of: 12/27/14 11:59 PM.  Always use your most recent med list.               acetaminophen 325 MG tablet  Commonly known as:  TYLENOL  Take 650 mg by mouth every 6 (six) hours as needed for mild pain.     albuterol (2.5 MG/3ML) 0.083% nebulizer solution  Commonly known as:  PROVENTIL  Take 2.5 mg by nebulization every 6 (six) hours as needed for wheezing or shortness of breath.     aspirin 81 MG tablet  Take 81 mg by mouth daily. For heart disease     BIOFREEZE 4 % Gel  Generic drug:  Menthol (Topical Analgesic)  Apply topically as needed.     bisacodyl 10 MG suppository  Commonly known as:  DULCOLAX  Place 10 mg rectally as needed for moderate constipation.     budesonide-formoterol 80-4.5 MCG/ACT inhaler  Commonly known as:  SYMBICORT  Inhale 2 puffs into the lungs 2 (two) times daily. For COPD     carbidopa-levodopa 25-100 MG tablet  Commonly known as:  SINEMET IR  Take 0.5 tablets by mouth 3 (three) times daily.     divalproex 250 MG DR tablet  Commonly known as:  DEPAKOTE  Take 125 mg by mouth at  bedtime. Give 4 capsules to equal  by mouth every HS For mood disorder     donepezil 10 MG tablet  Commonly known as:  ARICEPT  Take 10 mg by mouth at bedtime.     DULoxetine 60 MG capsule  Commonly known as:  CYMBALTA  Take 60 mg by mouth daily. For depression     famotidine 40 MG tablet  Commonly known as:  PEPCID  Take 40 mg by mouth daily. For reflux     ferrous sulfate 325 (65 FE) MG tablet  Take 325 mg by mouth daily with breakfast. For iron deficiency     insulin aspart 100 UNIT/ML injection  Commonly known as:  novoLOG  Inject into the skin 3 (three) times daily before meals. Hold if CBG less than 150.     insulin detemir 100 UNIT/ML injection  Commonly known as:  LEVEMIR  Inject 15 Units into the skin every morning.     levothyroxine 50 MCG tablet  Commonly known as:  SYNTHROID, LEVOTHROID  Take 50 mcg by mouth daily before breakfast. For hypothyroidism     lidocaine 5 %  Commonly known as:  LIDODERM  Place 1 patch onto the skin daily. Remove & Discard patch within 12 hours or as directed by MD  lisinopril 5 MG tablet  Commonly known as:  PRINIVIL,ZESTRIL  Take 5 mg by mouth daily. For HTN     memantine 14 MG Cp24 24 hr capsule  Commonly known as:  NAMENDA XR  Take 14 mg by mouth daily. For dementia     MILK OF MAGNESIA PO  Take 30 mLs by mouth as needed.     mirtazapine 15 MG tablet  Commonly known as:  REMERON  Take 15 mg by mouth at bedtime.     nitroGLYCERIN 0.4 MG/SPRAY spray  Commonly known as:  NITROLINGUAL  Place 1 spray under the tongue every 5 (five) minutes x 3 doses as needed for chest pain. For chronic heart failure     REFRESH OP  Apply 1 drop to eye 2 (two) times daily.     senna 176 MG/5ML Syrp  Commonly known as:  SENOKOT  Take by mouth.     shark liver oil-cocoa butter 0.25-3-85.5 % suppository  Commonly known as:  PREPARATION H  Place 1 suppository rectally as needed for hemorrhoids.     tiotropium 18 MCG inhalation  capsule  Commonly known as:  SPIRIVA  Place 18 mcg into inhaler and inhale daily. For COPD     torsemide 20 MG tablet  Commonly known as:  DEMADEX  Take 20 mg by mouth daily.     VITAMIN B COMPLEX IJ  Inject 1,000 Units as directed every 30 (thirty) days.     VITAMIN B-12 IJ  Inject 1 Dose as directed every 30 (thirty) days.     vitamin C 500 MG tablet  Commonly known as:  ASCORBIC ACID  Take 500 mg by mouth daily.     Vitamin D3 50000 UNITS Caps  Take 1 capsule by mouth daily. For Vit D dificiency        No orders of the defined types were placed in this encounter.    Immunization History  Administered Date(s) Administered  . Influenza Whole 10/20/2012  . Influenza-Unspecified 10/19/2013    Social History  Substance Use Topics  . Smoking status: Former Games developer  . Smokeless tobacco: Never Used     Comment: Quit 25 years ago.  . Alcohol Use: 0.6 oz/week    1 Shots of liquor per week     Comment: OCC.    Review of Systems  DATA OBTAINED: from patient, nurse GENERAL:  no fevers, fatigue, appetite changes SKIN: No itching, rash HEENT: No complaint RESPIRATORY: No cough, wheezing, SOB CARDIAC: No chest pain, palpitations, lower extremity edema  GI: No abdominal pain, No N/V/D or constipation, No heartburn or reflux  GU: No dysuria, frequency or urgency, or incontinence  MUSCULOSKELETAL: No unrelieved bone/joint pain NEUROLOGIC: No headache, dizziness; slower walking  PSYCHIATRIC: No overt anxiety or sadness  Filed Vitals:   12/27/14 1537  BP: 89/57  Pulse: 88  Temp: 97.2 F (36.2 C)  Resp: 18    Physical Exam  GENERAL APPEARANCE: Alert, conversant, No acute distress, WM in hall in [facility]  SKIN: No diaphoresis rash HEENT: Unremarkable RESPIRATORY: Breathing is even, unlabored. Lung sounds are clear   CARDIOVASCULAR: Heart RRR no murmurs, rubs or gallops. No peripheral edema  GASTROINTESTINAL: Abdomen is soft, non-tender, not distended w/ normal bowel  sounds.  GENITOURINARY: Bladder non tender, not distended  MUSCULOSKELETAL: No abnormal joints or musculature NEUROLOGIC: Cranial nerves 2-12 grossly intact. Moves all extremities PSYCHIATRIC: Mood and affect appropriate to situation, no behavioral issues  Patient Active Problem List   Diagnosis Date Noted  .  Parkinson's disease (HCC) 12/27/2014  . Vitamin D deficiency 12/27/2014  . Major depressive disorder, recurrent episode, mild (HCC) 03/28/2014  . Enlarged prostate without lower urinary tract symptoms (luts) 03/28/2014  . OA (osteoarthritis) 03/28/2014  . GERD (gastroesophageal reflux disease) 03/28/2014  . Insomnia 03/28/2014  . Hypothyroidism 02/27/2014  . Chronic atrial fibrillation (HCC) 01/23/2014  . Vitamin B12 deficiency 01/23/2014  . B12 deficiency anemia 01/23/2014  . Dysphagia, oropharyngeal 01/23/2014  . CKD (chronic kidney disease) stage 2, GFR 60-89 ml/min 10/10/2013  . Atherosclerotic peripheral vascular disease with ulceration (HCC) 10/10/2013  . Vascular dementia without behavioral disturbance 10/10/2013  . Memory change   . Gait abnormality   . ADD (attention deficit disorder)   . Influenza due to identified novel influenza A virus with other respiratory manifestations 01/19/2013  . Cellulitis of left lower extremity 11/25/2012  . Type 2 diabetes mellitus with neurological complications (HCC) 11/25/2012  . Type II or unspecified type diabetes mellitus with neurological manifestations, not stated as uncontrolled 08/29/2012  . COPD (chronic obstructive pulmonary disease) with emphysema (HCC) 08/29/2012  . Diabetic peripheral neuropathy associated with type 2 diabetes mellitus (HCC) 08/29/2012  . Chronic diastolic CHF (congestive heart failure) (HCC) 08/29/2012    CBC    Component Value Date/Time   WBC 4.9 05/17/2013 1042   WBC 6.4 03/02/2007 2118   RBC 4.38 05/17/2013 1042   RBC 3.55* 03/02/2007 2118   HGB 11.9* 05/17/2013 1042   HCT 37.0* 05/17/2013  1042   PLT 243 05/17/2013 1042   MCV 85 05/17/2013 1042   LYMPHSABS 1.3 05/17/2013 1042   LYMPHSABS 2.0 03/02/2007 2118   MONOABS 0.5 03/02/2007 2118   EOSABS 0.2 05/17/2013 1042   EOSABS 0.1 03/02/2007 2118   BASOSABS 0.0 05/17/2013 1042   BASOSABS 0.1 03/02/2007 2118    CMP     Component Value Date/Time   NA 137 02/04/2014   NA 130* 03/02/2007 2118   K 4.1 02/04/2014   CL 102 05/17/2013 1042   CO2 27 05/17/2013 1042   GLUCOSE 97 05/17/2013 1042   GLUCOSE 405* 03/02/2007 2118   BUN 50* 02/04/2014   BUN 20 03/02/2007 2118   CREATININE 1.1 02/04/2014   CREATININE 1.00 05/17/2013 1042   CALCIUM 9.4 05/17/2013 1042   PROT 6.3 05/17/2013 1042   ALBUMIN 3.9 05/17/2013 1042   AST 24 05/17/2013 1042   ALT 18 05/17/2013 1042   ALKPHOS 106 05/17/2013 1042   BILITOT 1.0 05/17/2013 1042   GFRNONAA 71 05/17/2013 1042   GFRAA 82 05/17/2013 1042    Assessment and Plan  Parkinson's disease (HCC) Has not been on medications prior but noted v markedly shuffling with decreased arm swing and stooped posture, cogwheeling of upper extremity, en bloc turning. Start low-dose sinemet and see if there is any improvement,  1/2 tablet Sinemet 25-100 three times a day; will need to monitor BP; will d/c demadex from 10 mg q am and 20 mg qHS to 10 mg BID and see if that helps some  Chronic diastolic CHF (congestive heart failure) Chronic and stable and since BP is runnng low, possibly from sinamet will decreased demadex to 10mg  BID  Vitamin D deficiency Level in 11/2014 was 46; plan cont 50,000 u monthly for next 3 months    Margit HanksALEXANDER, Tilly Pernice D, MD

## 2014-12-27 NOTE — Assessment & Plan Note (Signed)
Chronic and stable and since BP is runnng low, possibly from sinamet will decreased demadex to 10mg  BID

## 2014-12-27 NOTE — Assessment & Plan Note (Signed)
Level in 11/2014 was 46; plan cont 50,000 u monthly for next 3 months

## 2015-01-24 ENCOUNTER — Ambulatory Visit: Payer: Medicare Other | Admitting: Neurology
